# Patient Record
Sex: Female | Born: 1990 | Race: White | Hispanic: No | Smoking: Never smoker
Health system: Southern US, Community
[De-identification: ages and names within clinical notes are randomized; demographics above are authoritative.]

## PROBLEM LIST (undated history)

## (undated) ENCOUNTER — Ambulatory Visit

## (undated) DIAGNOSIS — Z8742 Personal history of other diseases of the female genital tract: Secondary | ICD-10-CM

## (undated) DIAGNOSIS — I10 Essential (primary) hypertension: Secondary | ICD-10-CM

## (undated) DIAGNOSIS — T7840XA Allergy, unspecified, initial encounter: Secondary | ICD-10-CM

## (undated) DIAGNOSIS — Z9889 Other specified postprocedural states: Secondary | ICD-10-CM

## (undated) HISTORY — DX: Essential (primary) hypertension: I10

## (undated) HISTORY — DX: Allergy, unspecified, initial encounter: T78.40XA

## (undated) HISTORY — DX: Personal history of other diseases of the female genital tract: Z87.42

## (undated) HISTORY — PX: WISDOM TOOTH EXTRACTION: SHX21

## (undated) HISTORY — DX: Other specified postprocedural states: Z98.890

## (undated) HISTORY — PX: ADENOIDECTOMY: SHX5191

---
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
## 2019-05-11 DIAGNOSIS — J3501 Chronic tonsillitis: Secondary | ICD-10-CM | POA: Insufficient documentation

## 2019-10-14 HISTORY — PX: CERVICAL POLYPECTOMY: SHX88

## 2020-08-08 ENCOUNTER — Ambulatory Visit: Admit: 2020-08-08 | Disposition: A | Discharge status: Home / Self Care | Payer: Self-pay

## 2020-08-08 ENCOUNTER — Other Ambulatory Visit: Payer: Self-pay

## 2020-08-08 ENCOUNTER — Ambulatory Visit
Admission: EM | Admit: 2020-08-08 | Discharge: 2020-08-08 | Disposition: A | Discharge status: Home / Self Care | Payer: 59 | Attending: Emergency Medicine | Admitting: Emergency Medicine

## 2020-08-08 DIAGNOSIS — J069 Acute upper respiratory infection, unspecified: Secondary | ICD-10-CM | POA: Diagnosis not present

## 2020-08-08 DIAGNOSIS — Z1152 Encounter for screening for COVID-19: Secondary | ICD-10-CM | POA: Diagnosis not present

## 2020-08-08 DIAGNOSIS — J358 Other chronic diseases of tonsils and adenoids: Secondary | ICD-10-CM

## 2020-08-08 MED ORDER — BENZONATATE 100 MG PO CAPS: 100.0000 mg | ORAL_CAPSULE | Freq: Three times a day (TID) | ORAL | 0 refills | Status: DC

## 2020-08-08 NOTE — ED Provider Notes (Signed)
[facility]    CSN: [id number] Arrival date & time: 08/08/20  1719      History   Chief Complaint Chief Complaint  Patient presents with  . Cough    since Monday  . Nasal Congestion    since Monday  . Fever    since Monday    HPI Lauren Larson is a 29 y.o. female  For URI symptoms since Monday.  Patient writes history: Endorsing dry cough with nasal congestion, postnasal drip, sore throat and fever.  Unknown T-max.  Taking OTC medications with some relief.  Denies chest pain, shortness of breath, vomiting or diarrhea.  No known sick exposures.  Did get rapid Covid test on day 1 of symptoms: Negative.  History reviewed. No pertinent past medical history.  There are no problems to display for this patient.   History reviewed. No pertinent surgical history.  OB History   No obstetric history on file.      Home Medications    Prior to Admission medications   Medication Sig Start Date End Date Taking? Authorizing Provider  benzonatate (TESSALON) 100 MG capsule Take 1 capsule (100 mg total) by mouth every 8 (eight) hours. 08/08/20   Hall-Potvin, [place], PA-C    Family History History reviewed. No pertinent family history.  Social History Social History   Tobacco Use  . Smoking status: Never Smoker  . Smokeless tobacco: Never Used  Vaping Use  . Vaping Use: Some days  Substance Use Topics  . Alcohol use: Yes  . Drug use: Never     Allergies   Patient has no known allergies.   Review of Systems Review of Systems  Constitutional: Positive for fever. Negative for fatigue.  HENT: Positive for postnasal drip, rhinorrhea and sore throat. Negative for congestion, dental problem, ear pain, facial swelling, hearing loss, sinus pain, trouble swallowing and voice change.   Eyes: Negative for photophobia, pain and visual disturbance.  Respiratory: Positive for cough. Negative for chest tightness, shortness of breath and wheezing.   Cardiovascular:  Negative for chest pain and palpitations.  Gastrointestinal: Negative for diarrhea and vomiting.  Musculoskeletal: Negative for arthralgias and myalgias.  Neurological: Negative for dizziness and headaches.     Physical Exam Triage Vital Signs ED Triage Vitals  Enc Vitals Group     BP 08/08/20 1726 112/71     Pulse Rate 08/08/20 1726 94     Resp 08/08/20 1726 18     Temp 08/08/20 1726 97.8 F (36.6 C)     Temp Source 08/08/20 1726 Oral     SpO2 08/08/20 1726 98 %     Weight --      Height --      Head Circumference --      Peak Flow --      Pain Score 08/08/20 1728 0     Pain Loc --      Pain Edu? --      Excl. in GC? --    No data found.  Updated Vital Signs BP 112/71 (BP Location: Right Arm)   Pulse 94   Temp 97.8 F (36.6 C) (Oral)   Resp 18   LMP 07/14/2020 (Approximate)   SpO2 98%   Visual Acuity Right Eye Distance:   Left Eye Distance:   Bilateral Distance:    Right Eye Near:   Left Eye Near:    Bilateral Near:     Physical Exam Constitutional:      General: She is not  in acute distress.    Appearance: She is not ill-appearing or diaphoretic.  HENT:     Head: Normocephalic and atraumatic.     Right Ear: Tympanic membrane and ear canal normal.     Left Ear: Tympanic membrane and ear canal normal.     Mouth/Throat:     Mouth: Mucous membranes are moist.     Pharynx: Oropharynx is clear. No oropharyngeal exudate or posterior oropharyngeal erythema.  Eyes:     General: No scleral icterus.    Conjunctiva/sclera: Conjunctivae normal.     Pupils: Pupils are equal, round, and reactive to light.  Neck:     Comments: Trachea midline, negative JVD Cardiovascular:     Rate and Rhythm: Normal rate and regular rhythm.     Heart sounds: No murmur heard.  No gallop.   Pulmonary:     Effort: Pulmonary effort is normal. No respiratory distress.     Breath sounds: No wheezing, rhonchi or rales.  Musculoskeletal:     Cervical back: Neck supple. No tenderness.   Lymphadenopathy:     Cervical: No cervical adenopathy.  Skin:    Capillary Refill: Capillary refill takes less than 2 seconds.     Coloration: Skin is not jaundiced or pale.     Findings: No rash.  Neurological:     General: No focal deficit present.     Mental Status: She is alert and oriented to person, place, and time.      UC Treatments / Results  Labs (all labs ordered are listed, but only abnormal results are displayed) Labs Reviewed  NOVEL CORONAVIRUS, NAA    EKG   Radiology No results found.  Procedures Procedures (including critical care time)  Medications Ordered in UC Medications - No data to display  Initial Impression / Assessment and Plan / UC Course  I have reviewed the triage vital signs and the nursing notes.  Pertinent labs & imaging results that were available during my care of the patient were reviewed by me and considered in my medical decision making (see chart for details).     Patient afebrile, nontoxic, with SpO2 98%.  Covid PCR pending.  Patient to quarantine until results are back.  We will treat supportively as outlined below.  Return precautions discussed, patient verbalized understanding and is agreeable to plan. Final Clinical Impressions(s) / UC Diagnoses   Final diagnoses:  Encounter for screening for COVID-19  URI with cough and congestion  Tonsil stone     Discharge Instructions     Your COVID test is pending - it is important to quarantine / isolate at home until your results are back. If you test positive and would like further evaluation for persistent or worsening symptoms, you may schedule an E-visit or virtual (video) visit throughout the [facility] app or website.  PLEASE NOTE: If you develop severe chest pain or shortness of breath please go to the ER or call 9-1-1 for further evaluation --> DO NOT schedule electronic or virtual visits for this. Please call our office for further guidance / recommendations as  needed.  For information about the Covid vaccine, please visit [email or web address]    ED Prescriptions    Medication Sig Dispense Auth. Provider   benzonatate (TESSALON) 100 MG capsule Take 1 capsule (100 mg total) by mouth every 8 (eight) hours. 21 capsule Hall-Potvin, [place], PA-C     PDMP not reviewed this encounter.   Hall-Potvin, [place], [place] 08/08/20 1853

## 2020-08-08 NOTE — Discharge Instructions (Addendum)
Your COVID test is pending - it is important to quarantine / isolate at home until your results are back. °If you test positive and would like further evaluation for persistent or worsening symptoms, you may schedule an E-visit or virtual (video) visit throughout the  MyChart app or website. ° °PLEASE NOTE: If you develop severe chest pain or shortness of breath please go to the ER or call 9-1-1 for further evaluation --> DO NOT schedule electronic or virtual visits for this. °Please call our office for further guidance / recommendations as needed. ° °For information about the Covid vaccine, please visit [email or web address] °

## 2020-08-08 NOTE — ED Triage Notes (Signed)
Pt states she has had cough, congestion, sore throat, and fever since Monday. Pt reports a rapid covid test on Monday with a negative result. Pt states the symptoms have worsened and otc meds have not provided much relief.

## 2020-08-09 LAB — SARS-COV-2, NAA 2 DAY TAT

## 2020-08-09 LAB — NOVEL CORONAVIRUS, NAA: SARS-CoV-2, NAA: NOT DETECTED

## 2020-09-16 ENCOUNTER — Ambulatory Visit
Admission: EM | Admit: 2020-09-16 | Discharge: 2020-09-16 | Disposition: A | Discharge status: Home / Self Care | Payer: 59 | Attending: Urgent Care | Admitting: Urgent Care

## 2020-09-16 ENCOUNTER — Other Ambulatory Visit: Payer: Self-pay

## 2020-09-16 DIAGNOSIS — K0889 Other specified disorders of teeth and supporting structures: Secondary | ICD-10-CM

## 2020-09-16 DIAGNOSIS — R591 Generalized enlarged lymph nodes: Secondary | ICD-10-CM

## 2020-09-16 MED ORDER — NAPROXEN 500 MG PO TABS: 500.0000 mg | ORAL_TABLET | Freq: Two times a day (BID) | ORAL | 0 refills | Status: DC

## 2020-09-16 MED ORDER — AMOXICILLIN-POT CLAVULANATE 875-125 MG PO TABS: 1.0000 | ORAL_TABLET | Freq: Two times a day (BID) | ORAL | 0 refills | Status: DC

## 2020-09-16 MED ORDER — FLUCONAZOLE 150 MG PO TABS: 150.0000 mg | ORAL_TABLET | ORAL | 0 refills | Status: DC

## 2020-09-16 MED ORDER — HYDROCODONE-ACETAMINOPHEN 5-325 MG PO TABS: 1.0000 | ORAL_TABLET | Freq: Four times a day (QID) | ORAL | 0 refills | Status: DC | PRN

## 2020-09-16 NOTE — ED Provider Notes (Signed)
  [facility]   MRN: [id number] DOB: 1991/08/25  Subjective:   Lauren Larson is a 29 y.o. female presenting for 1 day history of acute onset right-sided lower facial pain, dental pain, neck pain.  Patient is worried about having a dental abscess.  Has previously told that she needs to have her wisdom teeth and molars taken out.  She does not have a Education officer, community or dental insurance.  Has had a decreased appetite as well.  No current facility-administered medications for this encounter.  Current Outpatient Medications:  .  benzonatate (TESSALON) 100 MG capsule, Take 1 capsule (100 mg total) by mouth every 8 (eight) hours., Disp: 21 capsule, Rfl: 0   No Known Allergies  History reviewed. No pertinent past medical history.   History reviewed. No pertinent surgical history.  History reviewed. No pertinent family history.  Social History   Tobacco Use  . Smoking status: Never Smoker  . Smokeless tobacco: Never Used  Vaping Use  . Vaping Use: Some days  Substance Use Topics  . Alcohol use: Yes  . Drug use: Never    ROS   Objective:   Vitals: BP 112/64 (BP Location: Left Arm)   Pulse 71   Temp 98.2 F (36.8 C) (Oral)   Resp 17   LMP 09/16/2020 (Exact Date)   SpO2 98%   Physical Exam Constitutional:      General: She is not in acute distress.    Appearance: Normal appearance. She is well-developed. She is not ill-appearing, toxic-appearing or diaphoretic.  HENT:     Head: Normocephalic and atraumatic.     Right Ear: External ear normal.     Left Ear: External ear normal.     Nose: Nose normal.     Mouth/Throat:     Mouth: Mucous membranes are moist.     Pharynx: Oropharynx is clear.   Eyes:     General: No scleral icterus.       Right eye: No discharge.        Left eye: No discharge.     Extraocular Movements: Extraocular movements intact.     Conjunctiva/sclera: Conjunctivae normal.     Pupils: Pupils are equal, round, and reactive to light.    Cardiovascular:     Rate and Rhythm: Normal rate.  Pulmonary:     Effort: Pulmonary effort is normal.  Lymphadenopathy:     Cervical: Cervical adenopathy (left submandibular, superior anterior cervical) present.  Skin:    General: Skin is warm and dry.  Neurological:     General: No focal deficit present.     Mental Status: She is alert and oriented to person, place, and time.  Psychiatric:        Mood and Affect: Mood normal.        Behavior: Behavior normal.        Thought Content: Thought content normal.        Judgment: Judgment normal.      Assessment and Plan :   I have reviewed the PDMP during this encounter.  1. Pain, dental   2. Lymphadenopathy     Start Augmentin for dental infection/abscess, use naproxen for pain and inflammation, hydrocodone for breakthrough pain. Emphasized need for dental surgeon consult. Counseled patient on potential for adverse effects with medications prescribed/recommended today, strict ER and return-to-clinic precautions discussed, patient verbalized understanding.    Wallis Bamberg, PA-C 09/16/20 1349

## 2020-09-16 NOTE — Discharge Instructions (Addendum)
Please schedule naproxen twice daily with food for your severe pain.  If you still have pain despite taking naproxen regularly, this is breakthrough pain.  You can use hydrocodone, a narcotic pain medicine, once every 4-6 hours for this.  Once your pain is better controlled, switch back to just naproxen.   Make sure you schedule an appointment with a dentist/dental surgeon as soon as possible.  You may try some of the resources below.    [facility] [phone] extension [phone]  Dr. Civils [phone] [address]  [facility] [phone] [address]  [facility] [phone] extension [phone] [place], [place], [address] First come first serve for the first 10 clients.  May do simple extractions only, no wisdom teeth or surgery.  You may try the second for Thursday of the month starting at 6:30 AM.  [employer] You may call the school to see if they are still helping to provide dental care for emergent cases.  

## 2020-09-16 NOTE — ED Triage Notes (Signed)
Pt states she has had what feels like an abscess since yesterday but cannot visualize it. Pt states it is internal. Pt describes it in the area of her lymph node on left side. Pt states no other symptoms. Pt says it is not painful unless palpated. Pt is aox4 and ambulatory.

## 2021-07-25 ENCOUNTER — Encounter: Payer: Self-pay | Admitting: Emergency Medicine

## 2021-07-25 ENCOUNTER — Other Ambulatory Visit: Payer: Self-pay

## 2021-07-25 ENCOUNTER — Ambulatory Visit
Admission: EM | Admit: 2021-07-25 | Discharge: 2021-07-25 | Disposition: A | Discharge status: Home / Self Care | Payer: 59 | Attending: Internal Medicine | Admitting: Internal Medicine

## 2021-07-25 DIAGNOSIS — J029 Acute pharyngitis, unspecified: Secondary | ICD-10-CM | POA: Insufficient documentation

## 2021-07-25 DIAGNOSIS — J069 Acute upper respiratory infection, unspecified: Secondary | ICD-10-CM | POA: Insufficient documentation

## 2021-07-25 DIAGNOSIS — Z20822 Contact with and (suspected) exposure to covid-19: Secondary | ICD-10-CM | POA: Insufficient documentation

## 2021-07-25 LAB — POCT RAPID STREP A (OFFICE): Rapid Strep A Screen: NEGATIVE

## 2021-07-25 MED ORDER — PSEUDOEPHEDRINE HCL 30 MG PO TABS: 30.0000 mg | ORAL_TABLET | ORAL | 0 refills | Status: DC | PRN

## 2021-07-25 MED ORDER — CETIRIZINE HCL 10 MG PO TABS: 10.0000 mg | ORAL_TABLET | Freq: Every day | ORAL | 0 refills | Status: DC

## 2021-07-25 MED ORDER — FLUTICASONE PROPIONATE 50 MCG/ACT NA SUSP: 1.0000 | Freq: Every day | NASAL | 0 refills | Status: DC

## 2021-07-25 NOTE — ED Triage Notes (Signed)
Patient c/o sore throat x 4 days, head congestion, ears popping.  Patient has been taken Thera-Flu and Dayquil.  Patient is not vaccinated for COVID.

## 2021-07-25 NOTE — ED Provider Notes (Signed)
[facility]    CSN: [id number] Arrival date & time: 07/25/21  1224      History   Chief Complaint Chief Complaint  Patient presents with   Sore Throat    HPI Lauren Larson is a 30 y.o. female.   Patient presents with sore throat, nasal congestion, feeling of ear discomfort for 4 days.  Denies any cough.  Denies any fevers or known sick contacts.  Patient has been taking TheraFlu and DayQuil with improvement in symptoms.  Denies any chest pain or shortness of breath.  Denies nausea, vomiting, diarrhea.   Sore Throat   History reviewed. No pertinent past medical history.  There are no problems to display for this patient.   History reviewed. No pertinent surgical history.  OB History   No obstetric history on file.      Home Medications    Prior to Admission medications   Medication Sig Start Date End Date Taking? Authorizing Provider  cetirizine (ZYRTEC) 10 MG tablet Take 1 tablet (10 mg total) by mouth daily. 07/25/21  Yes Lance Muss, FNP  fluticasone (FLONASE) 50 MCG/ACT nasal spray Place 1 spray into both nostrils daily for 3 days. 07/25/21 07/28/21 Yes Lance Muss, FNP  pseudoephedrine (SUDAFED) 30 MG tablet Take 1 tablet (30 mg total) by mouth every 4 (four) hours as needed for congestion. 07/25/21  Yes Lance Muss, FNP  amoxicillin-clavulanate (AUGMENTIN) 875-125 MG tablet Take 1 tablet by mouth every 12 (twelve) hours. 09/16/20   Wallis Bamberg, PA-C  benzonatate (TESSALON) 100 MG capsule Take 1 capsule (100 mg total) by mouth every 8 (eight) hours. 08/08/20   Hall-Potvin, [place], PA-C  fluconazole (DIFLUCAN) 150 MG tablet Take 1 tablet (150 mg total) by mouth once a week. 09/16/20   Wallis Bamberg, PA-C  HYDROcodone-acetaminophen (NORCO/VICODIN) 5-325 MG tablet Take 1 tablet by mouth every 6 (six) hours as needed for severe pain. 09/16/20   Wallis Bamberg, PA-C  naproxen (NAPROSYN) 500 MG tablet Take 1 tablet (500 mg total) by mouth 2 (two)  times daily with a meal. 09/16/20   Wallis Bamberg, PA-C    Family History No family history on file.  Social History Social History   Tobacco Use   Smoking status: Never   Smokeless tobacco: Never  Vaping Use   Vaping Use: Some days  Substance Use Topics   Alcohol use: Yes   Drug use: Never     Allergies   Patient has no known allergies.   Review of Systems Review of Systems Per HPI  Physical Exam Triage Vital Signs ED Triage Vitals [07/25/21 1301]  Enc Vitals Group     BP 115/85     Pulse Rate 69     Resp 18     Temp 98.3 F (36.8 C)     Temp Source Oral     SpO2 98 %     Weight 180 lb (81.6 kg)     Height 5\' 3"  (1.6 m)     Head Circumference      Peak Flow      Pain Score 0     Pain Loc      Pain Edu?      Excl. in GC?    No data found.  Updated Vital Signs BP 115/85 (BP Location: Left Arm)   Pulse 69   Temp 98.3 F (36.8 C) (Oral)   Resp 18   Ht 5\' 3"  (1.6 m)   Wt 180 lb (81.6  kg)   LMP 07/24/2021   SpO2 98%   BMI 31.89 kg/m   Visual Acuity Right Eye Distance:   Left Eye Distance:   Bilateral Distance:    Right Eye Near:   Left Eye Near:    Bilateral Near:     Physical Exam Constitutional:      General: She is not in acute distress.    Appearance: Normal appearance. She is not toxic-appearing or diaphoretic.  HENT:     Head: Normocephalic and atraumatic.     Right Ear: Tympanic membrane and ear canal normal.     Left Ear: Tympanic membrane and ear canal normal.     Nose: Congestion present.     Mouth/Throat:     Mouth: Mucous membranes are moist.     Pharynx: Posterior oropharyngeal erythema present.  Eyes:     Extraocular Movements: Extraocular movements intact.     Conjunctiva/sclera: Conjunctivae normal.     Pupils: Pupils are equal, round, and reactive to light.  Cardiovascular:     Rate and Rhythm: Normal rate and regular rhythm.     Pulses: Normal pulses.     Heart sounds: Normal heart sounds.  Pulmonary:     Effort:  Pulmonary effort is normal. No respiratory distress.     Breath sounds: Normal breath sounds. No wheezing.  Abdominal:     General: Abdomen is flat. Bowel sounds are normal.     Palpations: Abdomen is soft.  Musculoskeletal:        General: Normal range of motion.     Cervical back: Normal range of motion.  Skin:    General: Skin is warm and dry.  Neurological:     General: No focal deficit present.     Mental Status: She is alert and oriented to person, place, and time. Mental status is at baseline.  Psychiatric:        Mood and Affect: Mood normal.        Behavior: Behavior normal.     UC Treatments / Results  Labs (all labs ordered are listed, but only abnormal results are displayed) Labs Reviewed  CULTURE, GROUP A STREP ([facility])  NOVEL CORONAVIRUS, NAA  POCT RAPID STREP A (OFFICE)    EKG   Radiology No results found.  Procedures Procedures (including critical care time)  Medications Ordered in UC Medications - No data to display  Initial Impression / Assessment and Plan / UC Course  I have reviewed the triage vital signs and the nursing notes.  Pertinent labs & imaging results that were available during my care of the patient were reviewed by me and considered in my medical decision making (see chart for details).     Patient presents with symptoms likely from a viral upper respiratory infection. Differential includes bacterial pneumonia, sinusitis, allergic rhinitis, Covid 19. Do not suspect underlying cardiopulmonary process. Symptoms seem unlikely related to ACS, CHF or COPD exacerbations, pneumonia, pneumothorax. Patient is nontoxic appearing and not in need of emergent medical intervention.  Rapid strep test was negative.  Throat culture and COVID-19 viral swab are pending.  Recommended symptom control with over the counter medications: Daily oral anti-histamine, Oral decongestant or IN corticosteroid, saline irrigations, cepacol lozenges, Robitussin, Delsym,  honey tea. Patient was offered scripts.  Return if symptoms fail to improve in 1-2 weeks or you develop shortness of breath, chest pain, severe headache. Patient states understanding and is agreeable.  Discharged with PCP followup.  Final Clinical Impressions(s) / UC Diagnoses   Final  diagnoses:  Viral upper respiratory infection  Sore throat  Encounter for laboratory testing for COVID-19 virus     Discharge Instructions      You likely having a viral upper respiratory infection. We recommended symptom control. I expect your symptoms to start improving in the next 1-2 weeks.   1. Take a daily allergy pill/anti-histamine like Zyrtec, Claritin, or Store brand consistently for 2 weeks.  You have been prescribed this medication.  2. For congestion you may try an oral decongestant like Mucinex or sudafed. You may also try intranasal flonase nasal spray or saline irrigations (neti pot, sinus cleanse).  You have been prescribed both of these medications.  3. For your sore throat you may try cepacol lozenges, salt water gargles, throat spray. Treatment of congestion may also help your sore throat.  4. For cough you may try Robitussen  5. Take Tylenol or Ibuprofen to help with pain/inflammation  6. Stay hydrated, drink plenty of fluids to keep throat coated and less irritated  Honey Tea For cough/sore throat try using a honey-based tea. Use 3 teaspoons of honey with juice squeezed from half lemon. Place shaved pieces of ginger into 1/2-1 cup of water and warm over stove top. Then mix the ingredients and repeat every 4 hours as needed.   Your rapid strep test was negative.  Throat culture and COVID-19 viral swab are pending.  We will call if they are positive.     ED Prescriptions     Medication Sig Dispense Auth. Provider   pseudoephedrine (SUDAFED) 30 MG tablet Take 1 tablet (30 mg total) by mouth every 4 (four) hours as needed for congestion. 30 tablet Lance Muss, FNP    cetirizine (ZYRTEC) 10 MG tablet Take 1 tablet (10 mg total) by mouth daily. 30 tablet Lance Muss, FNP   fluticasone (FLONASE) 50 MCG/ACT nasal spray Place 1 spray into both nostrils daily for 3 days. 16 g Lance Muss, FNP      PDMP not reviewed this encounter.   Lance Muss, FNP 07/25/21 1423

## 2021-07-25 NOTE — Discharge Instructions (Addendum)
You likely having a viral upper respiratory infection. We recommended symptom control. I expect your symptoms to start improving in the next 1-2 weeks.   1. Take a daily allergy pill/anti-histamine like Zyrtec, Claritin, or Store brand consistently for 2 weeks.  You have been prescribed this medication.  2. For congestion you may try an oral decongestant like Mucinex or sudafed. You may also try intranasal flonase nasal spray or saline irrigations (neti pot, sinus cleanse).  You have been prescribed both of these medications.  3. For your sore throat you may try cepacol lozenges, salt water gargles, throat spray. Treatment of congestion may also help your sore throat.  4. For cough you may try Robitussen  5. Take Tylenol or Ibuprofen to help with pain/inflammation  6. Stay hydrated, drink plenty of fluids to keep throat coated and less irritated  Honey Tea For cough/sore throat try using a honey-based tea. Use 3 teaspoons of honey with juice squeezed from half lemon. Place shaved pieces of ginger into 1/2-1 cup of water and warm over stove top. Then mix the ingredients and repeat every 4 hours as needed.   Your rapid strep test was negative.  Throat culture and COVID-19 viral swab are pending.  We will call if they are positive.

## 2021-07-26 LAB — NOVEL CORONAVIRUS, NAA: SARS-CoV-2, NAA: NOT DETECTED

## 2021-07-26 LAB — SARS-COV-2, NAA 2 DAY TAT

## 2021-07-28 LAB — CULTURE, GROUP A STREP (THRC)

## 2021-11-06 ENCOUNTER — Ambulatory Visit (INDEPENDENT_AMBULATORY_CARE_PROVIDER_SITE_OTHER): Payer: 59

## 2021-11-06 ENCOUNTER — Ambulatory Visit
Admission: RE | Admit: 2021-11-06 | Discharge: 2021-11-06 | Disposition: A | Discharge status: Home / Self Care | Payer: 59 | Source: Ambulatory Visit | Attending: Internal Medicine | Admitting: Internal Medicine

## 2021-11-06 VITALS — BP 137/71 | HR 90 | Temp 98.7°F | Resp 18

## 2021-11-06 DIAGNOSIS — R0781 Pleurodynia: Secondary | ICD-10-CM | POA: Diagnosis not present

## 2021-11-06 DIAGNOSIS — R1012 Left upper quadrant pain: Secondary | ICD-10-CM

## 2021-11-06 DIAGNOSIS — R079 Chest pain, unspecified: Secondary | ICD-10-CM

## 2021-11-06 MED ORDER — METHOCARBAMOL 500 MG PO TABS: 500.0000 mg | ORAL_TABLET | Freq: Every evening | ORAL | 0 refills | Status: DC | PRN

## 2021-11-06 MED ORDER — IBUPROFEN 600 MG PO TABS: 600.0000 mg | ORAL_TABLET | Freq: Four times a day (QID) | ORAL | 0 refills | Status: DC | PRN

## 2021-11-06 NOTE — ED Triage Notes (Signed)
Pt c/o LUQ pain worse at night. Sharp pain, cramping, constipation (chronic).   Denies nausea, vomiting, diarrhea.   Onset ~ 3 weeks ago

## 2021-11-06 NOTE — Discharge Instructions (Addendum)
Please take medications as prescribed Gentle range of motion exercises Please do not drive or operate heavy machinery after taking muscle relaxants because they will make you drowsy I recommend you take muscle relaxant only at bedtime Heating pad use on a 20-minute on-20 minutes off cycle is recommended Return to urgent care if symptoms worsen Chest x-ray is negative for fracture.

## 2021-11-07 NOTE — ED Provider Notes (Signed)
[facility]    CSN: [id number] Arrival date & time: 11/06/21  1651      History   Chief Complaint Chief Complaint  Patient presents with   Abdominal Pain    HPI Lauren Larson is a 31 y.o. female comes to the urgent care with a 3-week history of left upper quadrant abdominal pain.  Patient's symptoms started 3 weeks ago and has been persistent.  Pain is described as sharp and aggravated by movement and laying back.  Pain is typically worse at night.  Is not associated with oral intake.  No history of trauma.  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  She has used heating pad 3 hours at a time with no relief in her symptoms.  Patient denies any shortness of breath, cough or sputum production.  No cough..  No long distance travel.  No fever or chills.  Patient has a history of constipation but had a bowel movement yesterday.  No bloody stools or change in stool caliber.  No night sweats, weight loss or early satiety.  HPI  History reviewed. No pertinent past medical history.  There are no problems to display for this patient.   History reviewed. No pertinent surgical history.  OB History   No obstetric history on file.      Home Medications    Prior to Admission medications   Medication Sig Start Date End Date Taking? Authorizing Provider  ibuprofen (ADVIL) 600 MG tablet Take 1 tablet (600 mg total) by mouth every 6 (six) hours as needed. 11/06/21  Yes Gabby Rackers, Britta Mccreedy, MD  methocarbamol (ROBAXIN) 500 MG tablet Take 1 tablet (500 mg total) by mouth at bedtime as needed for muscle spasms. 11/06/21  Yes Rudie Rikard, Britta Mccreedy, MD  cetirizine (ZYRTEC) 10 MG tablet Take 1 tablet (10 mg total) by mouth daily. 07/25/21   Gustavus Bryant, FNP  fluticasone (FLONASE) 50 MCG/ACT nasal spray Place 1 spray into both nostrils daily for 3 days. 07/25/21 07/28/21  Gustavus Bryant, FNP    Family History History reviewed. No pertinent family history.  Social  History Social History   Tobacco Use   Smoking status: Never   Smokeless tobacco: Never  Vaping Use   Vaping Use: Some days  Substance Use Topics   Alcohol use: Yes   Drug use: Never     Allergies   Patient has no known allergies.   Review of Systems Review of Systems  HENT: Negative.    Respiratory:  Negative for cough, chest tightness and shortness of breath.   Cardiovascular:  Positive for chest pain.  Gastrointestinal:  Positive for abdominal pain and constipation. Negative for diarrhea, nausea and vomiting.  Musculoskeletal: Negative.     Physical Exam Triage Vital Signs ED Triage Vitals  Enc Vitals Group     BP 11/06/21 1736 137/71     Pulse Rate 11/06/21 1736 90     Resp 11/06/21 1736 18     Temp 11/06/21 1736 98.7 F (37.1 C)     Temp Source 11/06/21 1736 Oral     SpO2 11/06/21 1736 99 %     Weight --      Height --      Head Circumference --      Peak Flow --      Pain Score 11/06/21 1737 0     Pain Loc --      Pain Edu? --  Excl. in GC? --    No data found.  Updated Vital Signs BP 137/71 (BP Location: Left Arm)    Pulse 90    Temp 98.7 F (37.1 C) (Oral)    Resp 18    SpO2 99%   Visual Acuity Right Eye Distance:   Left Eye Distance:   Bilateral Distance:    Right Eye Near:   Left Eye Near:    Bilateral Near:     Physical Exam Vitals and nursing note reviewed.  Constitutional:      General: She is in acute distress.     Appearance: She is not ill-appearing.  Cardiovascular:     Rate and Rhythm: Normal rate and regular rhythm.  Pulmonary:     Effort: Pulmonary effort is normal. No respiratory distress.     Breath sounds: Normal breath sounds. No wheezing.  Abdominal:     General: Abdomen is flat. Bowel sounds are normal.     Palpations: Abdomen is soft. There is no shifting dullness, fluid wave, hepatomegaly or splenomegaly.     Tenderness: There is no abdominal tenderness.     Comments: Tenderness to palpation over the lower  part of the left chest  Neurological:     Mental Status: She is alert.     UC Treatments / Results  Labs (all labs ordered are listed, but only abnormal results are displayed) Labs Reviewed - No data to display  EKG   Radiology DG Ribs Unilateral W/Chest Left  Result Date: 11/06/2021 CLINICAL DATA:  Left lower chest pain, left upper quadrant pain. EXAM: LEFT RIBS AND CHEST - 3+ VIEW COMPARISON:  08/24/2009 FINDINGS: No fracture or other bone lesions are seen involving the ribs. There is no evidence of pneumothorax or pleural effusion. Both lungs are clear. Heart size and mediastinal contours are within normal limits. IMPRESSION: Negative. Electronically Signed   By: Charlett Nose M.D.   On: 11/06/2021 18:48    Procedures Procedures (including critical care time)  Medications Ordered in UC Medications - No data to display  Initial Impression / Assessment and Plan / UC Course  I have reviewed the triage vital signs and the nursing notes.  Pertinent labs & imaging results that were available during my care of the patient were reviewed by me and considered in my medical decision making (see chart for details).     1.  Left-sided chest pain: This is likely musculoskeletal Abdominal exam is unremarkable Ibuprofen as needed for pain Robaxin given with precautions. Heating pad use 20 minutes on-20 minutes off Gentle range of motion exercises Chest x-ray is negative for acute lung infiltrate or rib fractures. Return precautions given Final Clinical Impressions(s) / UC Diagnoses   Final diagnoses:  Rib pain on left side     Discharge Instructions      Please take medications as prescribed Gentle range of motion exercises Please do not drive or operate heavy machinery after taking muscle relaxants because they will make you drowsy I recommend you take muscle relaxant only at bedtime Heating pad use on a 20-minute on-20 minutes off cycle is recommended Return to urgent care  if symptoms worsen Chest x-ray is negative for fracture.   ED Prescriptions     Medication Sig Dispense Auth. Provider   ibuprofen (ADVIL) 600 MG tablet Take 1 tablet (600 mg total) by mouth every 6 (six) hours as needed. 30 tablet Doris Gruhn, Britta Mccreedy, MD   methocarbamol (ROBAXIN) 500 MG tablet Take 1 tablet (500 mg total)  by mouth at bedtime as needed for muscle spasms. 20 tablet Kona Yusuf, Britta MccreedyPhilip O, MD      PDMP not reviewed this encounter.   Merrilee JanskyLamptey, Sandeep Delagarza O, MD 11/07/21 [phone]

## 2022-04-29 ENCOUNTER — Ambulatory Visit
Admission: RE | Admit: 2022-04-29 | Discharge: 2022-04-29 | Discharge status: Absent without leave | Payer: 59 | Source: Ambulatory Visit

## 2022-05-01 ENCOUNTER — Ambulatory Visit
Admission: RE | Admit: 2022-05-01 | Discharge: 2022-05-01 | Disposition: A | Discharge status: Home / Self Care | Payer: 59 | Source: Ambulatory Visit | Attending: Internal Medicine | Admitting: Internal Medicine

## 2022-05-01 VITALS — BP 124/79 | HR 89 | Temp 98.1°F | Resp 18

## 2022-05-01 DIAGNOSIS — J069 Acute upper respiratory infection, unspecified: Secondary | ICD-10-CM | POA: Diagnosis not present

## 2022-05-01 MED ORDER — FLUTICASONE PROPIONATE 50 MCG/ACT NA SUSP: 1.0000 | Freq: Every day | NASAL | 0 refills | Status: DC

## 2022-05-01 MED ORDER — PROMETHAZINE-DM 6.25-15 MG/5ML PO SYRP: 5.0000 mL | ORAL_SOLUTION | Freq: Four times a day (QID) | ORAL | 0 refills | Status: DC | PRN

## 2022-05-01 NOTE — ED Triage Notes (Signed)
Pt is present today with c/o chest congestion, cough, and sore throat. Pt sx started Monday

## 2022-05-01 NOTE — ED Provider Notes (Signed)
[facility]    CSN: [id number] Arrival date & time: 05/01/22  1241      History   Chief Complaint Chief Complaint  Patient presents with   Cough    Sore throat, cough with congestion - Entered by patient   Sore Throat   Nasal Congestion    HPI Lauren Larson is a 31 y.o. female.   Patient presents with nasal congestion, sore throat, productive cough, chest congestion that has been present for about 3 days.  Denies any known sick contacts but does report that she attended a concert prior to symptoms starting.  Denies any known fevers at home.  Denies chest pain, shortness of breath, ear pain, nausea, vomiting, diarrhea, abdominal pain.  Patient has taken Mucinex with minimal improvement in symptoms.  Denies history of asthma.   Cough Sore Throat    History reviewed. No pertinent past medical history.  There are no problems to display for this patient.   History reviewed. No pertinent surgical history.  OB History   No obstetric history on file.      Home Medications    Prior to Admission medications   Medication Sig Start Date End Date Taking? Authorizing Provider  fluticasone (FLONASE) 50 MCG/ACT nasal spray Place 1 spray into both nostrils daily. 05/01/22  Yes Yobany Vroom, Acie Fredrickson, FNP  promethazine-dextromethorphan (PROMETHAZINE-DM) 6.25-15 MG/5ML syrup Take 5 mLs by mouth 4 (four) times daily as needed for cough. 05/01/22  Yes Dulcie Gammon, Acie Fredrickson, FNP  cetirizine (ZYRTEC) 10 MG tablet Take 1 tablet (10 mg total) by mouth daily. 07/25/21   Gustavus Bryant, FNP  ibuprofen (ADVIL) 600 MG tablet Take 1 tablet (600 mg total) by mouth every 6 (six) hours as needed. 11/06/21   Lamptey, Britta Mccreedy, MD  methocarbamol (ROBAXIN) 500 MG tablet Take 1 tablet (500 mg total) by mouth at bedtime as needed for muscle spasms. 11/06/21   Lamptey, Britta Mccreedy, MD    Family History Family History  Problem Relation Age of Onset   Diabetes Mother     Social History Social History    Tobacco Use   Smoking status: Never   Smokeless tobacco: Never  Vaping Use   Vaping Use: Some days  Substance Use Topics   Alcohol use: Yes   Drug use: Never     Allergies   Patient has no known allergies.   Review of Systems Review of Systems Per HPI  Physical Exam Triage Vital Signs ED Triage Vitals  Enc Vitals Group     BP 05/01/22 1250 124/79     Pulse Rate 05/01/22 1250 89     Resp 05/01/22 1250 18     Temp 05/01/22 1250 98.1 F (36.7 C)     Temp src --      SpO2 05/01/22 1250 95 %     Weight --      Height --      Head Circumference --      Peak Flow --      Pain Score 05/01/22 1249 0     Pain Loc --      Pain Edu? --      Excl. in GC? --    No data found.  Updated Vital Signs BP 124/79   Pulse 89   Temp 98.1 F (36.7 C)   Resp 18   SpO2 95%   Visual Acuity Right Eye Distance:   Left Eye Distance:   Bilateral Distance:    Right Eye Near:  Left Eye Near:    Bilateral Near:     Physical Exam Constitutional:      General: She is not in acute distress.    Appearance: Normal appearance. She is not toxic-appearing or diaphoretic.  HENT:     Head: Normocephalic and atraumatic.     Right Ear: Tympanic membrane and ear canal normal.     Left Ear: Tympanic membrane and ear canal normal.     Nose: Congestion present.     Mouth/Throat:     Mouth: Mucous membranes are moist.     Pharynx: No posterior oropharyngeal erythema.  Eyes:     Extraocular Movements: Extraocular movements intact.     Conjunctiva/sclera: Conjunctivae normal.     Pupils: Pupils are equal, round, and reactive to light.  Cardiovascular:     Rate and Rhythm: Normal rate and regular rhythm.     Pulses: Normal pulses.     Heart sounds: Normal heart sounds.  Pulmonary:     Effort: Pulmonary effort is normal. No respiratory distress.     Breath sounds: Normal breath sounds. No stridor. No wheezing, rhonchi or rales.  Abdominal:     General: Abdomen is flat. Bowel sounds  are normal.     Palpations: Abdomen is soft.  Musculoskeletal:        General: Normal range of motion.     Cervical back: Normal range of motion.  Skin:    General: Skin is warm and dry.  Neurological:     General: No focal deficit present.     Mental Status: She is alert and oriented to person, place, and time. Mental status is at baseline.  Psychiatric:        Mood and Affect: Mood normal.        Behavior: Behavior normal.      UC Treatments / Results  Labs (all labs ordered are listed, but only abnormal results are displayed) Labs Reviewed - No data to display  EKG   Radiology No results found.  Procedures Procedures (including critical care time)  Medications Ordered in UC Medications - No data to display  Initial Impression / Assessment and Plan / UC Course  I have reviewed the triage vital signs and the nursing notes.  Pertinent labs & imaging results that were available during my care of the patient were reviewed by me and considered in my medical decision making (see chart for details).     Patient presents with symptoms likely from a viral upper respiratory infection. Differential includes bacterial pneumonia, sinusitis, allergic rhinitis, COVID-19, flu. Do not suspect underlying cardiopulmonary process. Symptoms seem unlikely related to ACS, CHF or COPD exacerbations, pneumonia, pneumothorax. Patient is nontoxic appearing and not in need of emergent medical intervention.  No suspicion for strep throat given appearance of posterior pharynx exam so will defer strep testing.  Patient offered COVID testing but declined.  Recommended symptom control with over medications to treat symptoms.  Promethazine DM and Flonase prescribed for patient.  Advised patient that cough medication can cause drowsiness.  Discussed supportive care with patient.   Return if symptoms fail to improve in 1-2 weeks or you develop shortness of breath, chest pain, severe headache. Patient  states understanding and is agreeable.  Discharged with PCP followup.  Final Clinical Impressions(s) / UC Diagnoses   Final diagnoses:  Viral upper respiratory tract infection with cough     Discharge Instructions      It appears that you have a viral upper respiratory infection that  should run its course and self resolve with symptomatic treatment.  You have been sent 2 medications to alleviate symptoms.  Please be advised that the cough medication can cause drowsiness.  Please follow-up if symptoms persist or worsen.    ED Prescriptions     Medication Sig Dispense Auth. Provider   fluticasone (FLONASE) 50 MCG/ACT nasal spray Place 1 spray into both nostrils daily. 16 g Ervin Knack E, [place]   promethazine-dextromethorphan (PROMETHAZINE-DM) 6.25-15 MG/5ML syrup Take 5 mLs by mouth 4 (four) times daily as needed for cough. 118 mL Gustavus Bryant, [place]      PDMP not reviewed this encounter.   Gustavus Bryant, [place] 05/01/22 1304

## 2022-05-01 NOTE — Discharge Instructions (Signed)
It appears that you have a viral upper respiratory infection that should run its course and self resolve with symptomatic treatment.  You have been sent 2 medications to alleviate symptoms.  Please be advised that the cough medication can cause drowsiness.  Please follow-up if symptoms persist or worsen.

## 2022-05-05 ENCOUNTER — Ambulatory Visit: Admit: 2022-05-05 | Discharge status: Against Medical Advice | Payer: 59

## 2022-05-06 ENCOUNTER — Ambulatory Visit: Payer: 59

## 2022-08-15 IMAGING — DX DG RIBS W/ CHEST 3+V*L*
5 series · 5 of 5 positions shown · non-contrast
Comparison: 08/24/2009

CLINICAL DATA: Left lower chest pain, left upper quadrant pain.

EXAM:
LEFT RIBS AND CHEST - 3+ VIEW

[chest pa]
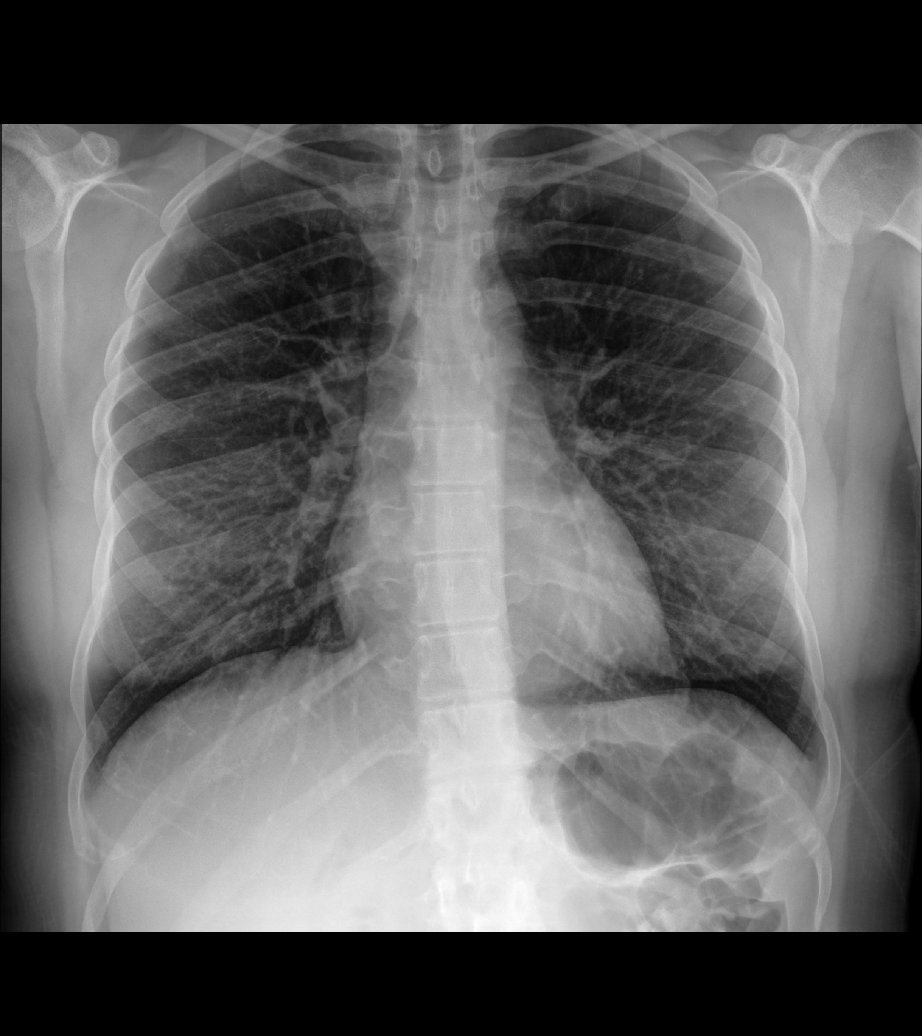

[ribs pa upper]
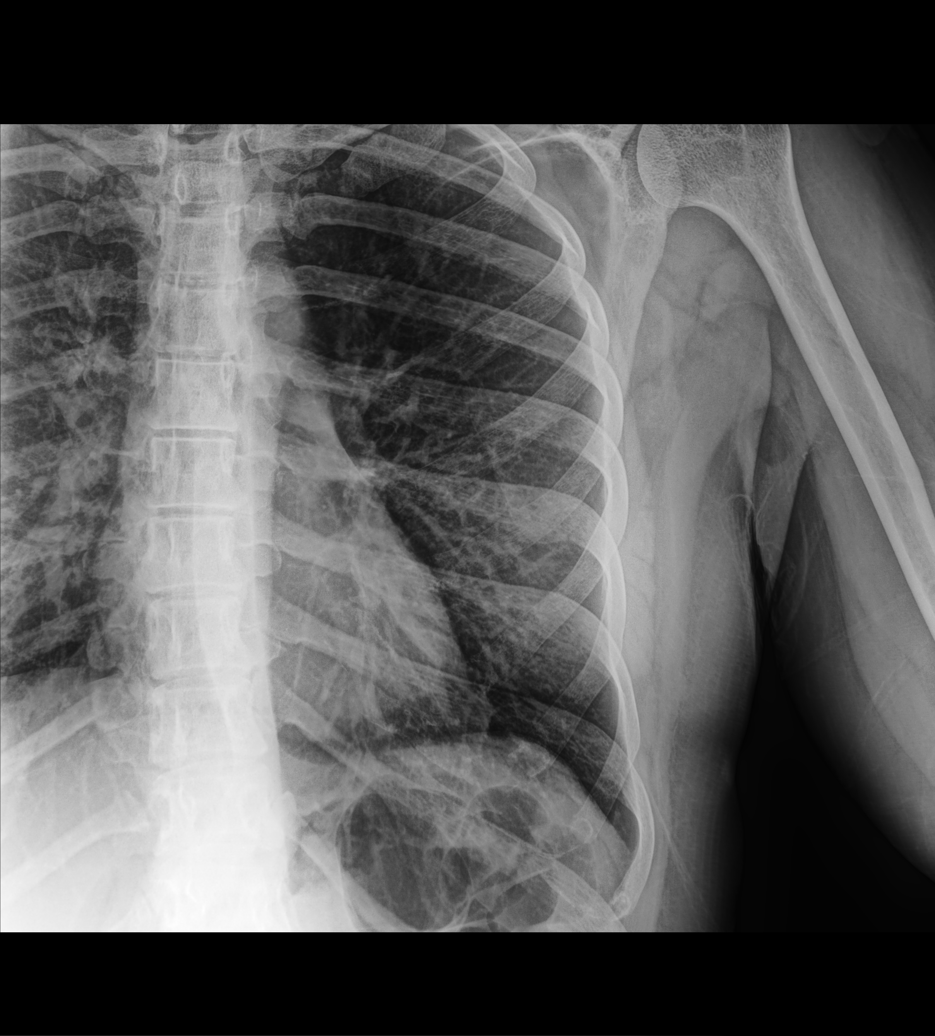

[ribs pa lower]
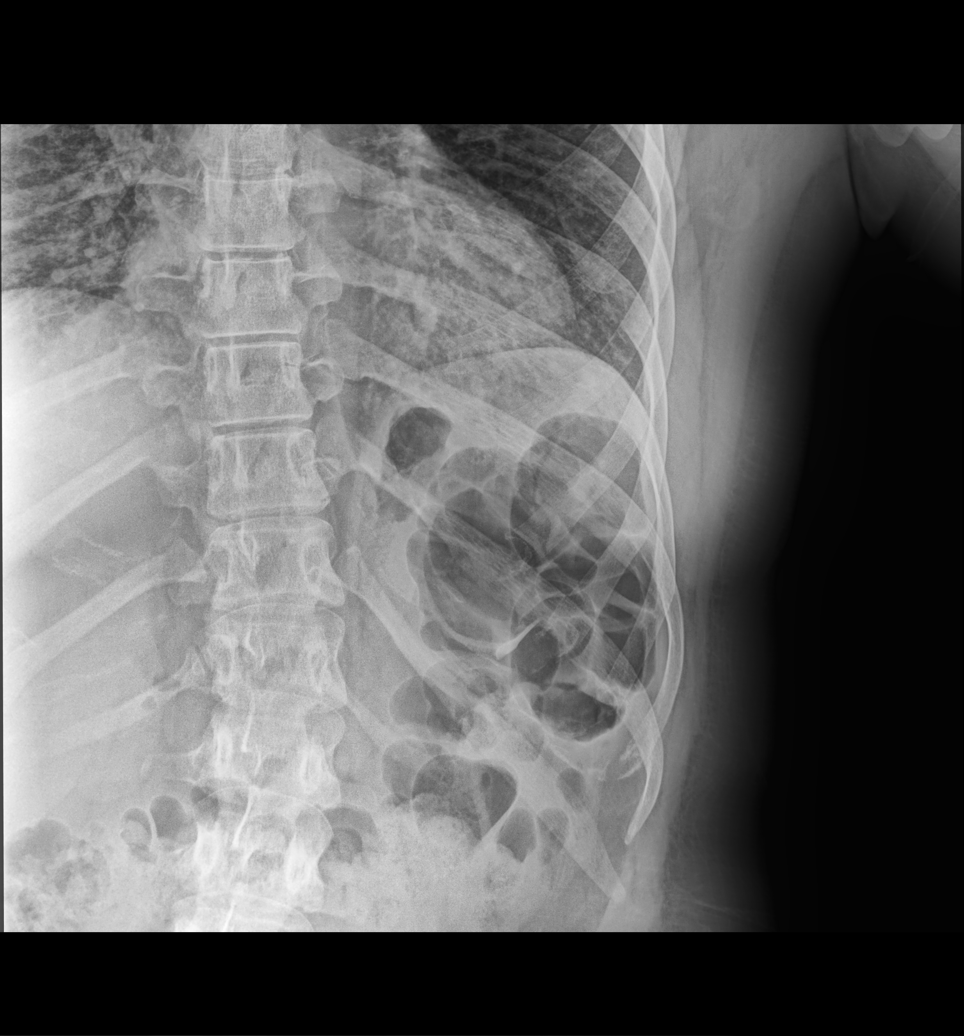

[ribs pa obl upper]
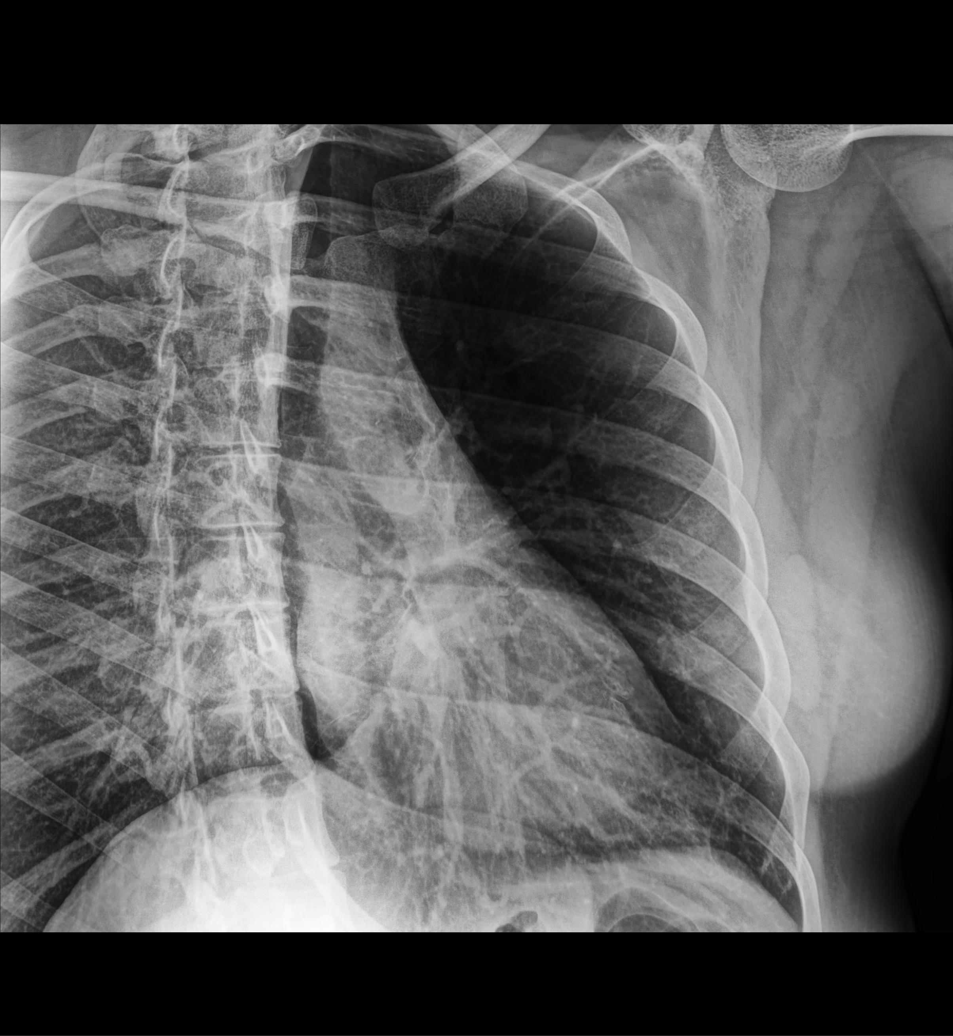

[ribs pa obl lower]
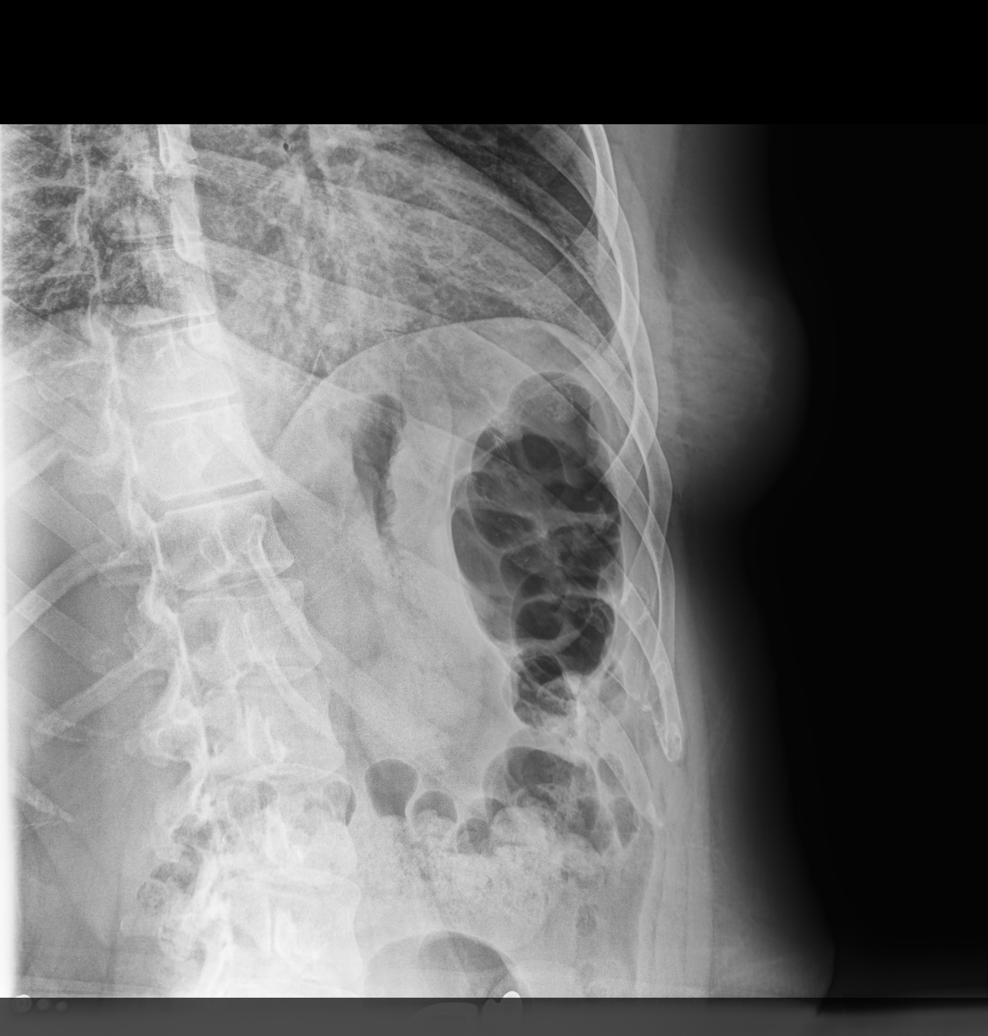

[5 of 5 positions shown; findings below may reference images not displayed]

FINDINGS: No fracture or other bone lesions are seen involving the ribs. There
is no evidence of pneumothorax or pleural effusion. Both lungs are
clear. Heart size and mediastinal contours are within normal limits.
IMPRESSION: Negative.

## 2023-07-21 ENCOUNTER — Ambulatory Visit
Admission: EM | Admit: 2023-07-21 | Discharge: 2023-07-21 | Disposition: A | Discharge status: Home / Self Care | Payer: Medicaid Other

## 2023-07-21 ENCOUNTER — Ambulatory Visit: Payer: Self-pay

## 2023-07-21 DIAGNOSIS — L0100 Impetigo, unspecified: Secondary | ICD-10-CM | POA: Diagnosis not present

## 2023-07-21 LAB — LAB REPORT - SCANNED: EGFR: 103

## 2023-07-21 MED ORDER — AMOXICILLIN-POT CLAVULANATE 875-125 MG PO TABS: 1.0000 | ORAL_TABLET | Freq: Two times a day (BID) | ORAL | 0 refills | Status: DC

## 2023-07-21 MED ORDER — MUPIROCIN 2 % EX OINT: 1.0000 | TOPICAL_OINTMENT | Freq: Two times a day (BID) | CUTANEOUS | 0 refills | Status: AC

## 2023-07-21 NOTE — ED Provider Notes (Signed)
[facility]    CSN: [id number] Arrival date & time: 07/21/23  1135      History   Chief Complaint Chief Complaint  Patient presents with   Skin Problem    HPI Lauren Larson is a 32 y.o. female.   Patient here today for evaluation of fever, sore throat that started last Thursday and improved by that night. She is concerned because lymph nodes seem to be enlarged and her ears continue to pop. She also notes that there is "sore" inside her nose that concerns her. She has not had any vomiting or diarrhea.   The history is provided by the patient.    History reviewed. No pertinent past medical history.  Patient Active Problem List   Diagnosis Date Noted   Chronic tonsillitis 05/11/2019    History reviewed. No pertinent surgical history.  OB History   No obstetric history on file.      Home Medications    Prior to Admission medications   Medication Sig Start Date End Date Taking? Authorizing Provider  amoxicillin-clavulanate (AUGMENTIN) 875-125 MG tablet Take 1 tablet by mouth every 12 (twelve) hours. 07/21/23  Yes Tomi Bamberger, PA-C  mupirocin ointment (BACTROBAN) 2 % Apply 1 Application topically 2 (two) times daily for 7 days. 07/21/23 07/28/23 Yes Tomi Bamberger, PA-C  cetirizine (ZYRTEC) 10 MG tablet Take 1 tablet (10 mg total) by mouth daily. 07/25/21   Gustavus Bryant, FNP  fluticasone (FLONASE) 50 MCG/ACT nasal spray Place 1 spray into both nostrils daily. 05/01/22   Gustavus Bryant, FNP  ibuprofen (ADVIL) 600 MG tablet Take 1 tablet (600 mg total) by mouth every 6 (six) hours as needed. 11/06/21   Lamptey, Britta Mccreedy, MD  methocarbamol (ROBAXIN) 500 MG tablet Take 1 tablet (500 mg total) by mouth at bedtime as needed for muscle spasms. 11/06/21   Merrilee Jansky, MD  promethazine-dextromethorphan (PROMETHAZINE-DM) 6.25-15 MG/5ML syrup Take 5 mLs by mouth 4 (four) times daily as needed for cough. 05/01/22   Gustavus Bryant, FNP    Family History Family  History  Problem Relation Age of Onset   Diabetes Mother    Fibromyalgia Mother    Heart disease Maternal Grandmother        History of Stints   Heart disease Maternal Aunt        History of Stints    Social History Social History   Tobacco Use   Smoking status: Never   Smokeless tobacco: Never  Vaping Use   Vaping status: Some Days   Substances: Nicotine, Flavoring  Substance Use Topics   Alcohol use: Yes    Comment: Occassionally.   Drug use: Never     Allergies   Patient has no known allergies.   Review of Systems Review of Systems  Constitutional:  Negative for chills and fever.  HENT:  Positive for congestion, sinus pressure and sore throat. Negative for ear pain.   Eyes:  Negative for discharge and redness.  Respiratory:  Negative for cough, shortness of breath and wheezing.   Gastrointestinal:  Negative for abdominal pain, diarrhea, nausea and vomiting.     Physical Exam Triage Vital Signs ED Triage Vitals  Encounter Vitals Group     BP 07/21/23 1153 (!) 150/100     Systolic BP Percentile --      Diastolic BP Percentile --      Pulse Rate 07/21/23 1153 83     Resp 07/21/23 1153 18  Temp 07/21/23 1153 98.7 F (37.1 C)     Temp Source 07/21/23 1153 Oral     SpO2 07/21/23 1153 99 %     Weight 07/21/23 1156 185 lb (83.9 kg)     Height 07/21/23 1156 5\' 3"  (1.6 m)     Head Circumference --      Peak Flow --      Pain Score 07/21/23 1156 0     Pain Loc --      Pain Education --      Exclude from Growth Chart --    No data found.  Updated Vital Signs BP (!) 148/90 (BP Location: Right Arm)   Pulse 83   Temp 98.7 F (37.1 C) (Oral)   Resp 18   Ht 5\' 3"  (1.6 m)   Wt 185 lb (83.9 kg)   LMP 07/13/2023 (Exact Date)   SpO2 99%   BMI 32.77 kg/m      Physical Exam Vitals and nursing note reviewed.  Constitutional:      General: She is not in acute distress.    Appearance: Normal appearance. She is not ill-appearing.  HENT:     Head:  Normocephalic and atraumatic.     Nose: Congestion present.     Comments: Honey crusted lesion in distal nares    Mouth/Throat:     Mouth: Mucous membranes are moist.     Pharynx: No oropharyngeal exudate or posterior oropharyngeal erythema.  Eyes:     Conjunctiva/sclera: Conjunctivae normal.  Cardiovascular:     Rate and Rhythm: Normal rate and regular rhythm.     Heart sounds: Normal heart sounds. No murmur heard. Pulmonary:     Effort: Pulmonary effort is normal. No respiratory distress.     Breath sounds: Normal breath sounds. No wheezing, rhonchi or rales.  Skin:    General: Skin is warm and dry.  Neurological:     Mental Status: She is alert.  Psychiatric:        Mood and Affect: Mood normal.        Thought Content: Thought content normal.      UC Treatments / Results  Labs (all labs ordered are listed, but only abnormal results are displayed) Labs Reviewed - No data to display  EKG   Radiology No results found.  Procedures Procedures (including critical care time)  Medications Ordered in UC Medications - No data to display  Initial Impression / Assessment and Plan / UC Course  I have reviewed the triage vital signs and the nursing notes.  Pertinent labs & imaging results that were available during my care of the patient were reviewed by me and considered in my medical decision making (see chart for details).    Suspect viral etiology of improving upper respiratory symptoms. Will treat to cover impetigo with oral augmentin as well as topical mupirocin.   Final Clinical Impressions(s) / UC Diagnoses   Final diagnoses:  Impetigo   Discharge Instructions   None    ED Prescriptions     Medication Sig Dispense Auth. Provider   amoxicillin-clavulanate (AUGMENTIN) 875-125 MG tablet Take 1 tablet by mouth every 12 (twelve) hours. 14 tablet Erma Pinto F, PA-C   mupirocin ointment (BACTROBAN) 2 % Apply 1 Application topically 2 (two) times daily for 7  days. 22 g Tomi Bamberger, PA-C      PDMP not reviewed this encounter.   Tomi Bamberger, PA-C 07/27/23 2241

## 2023-07-21 NOTE — ED Triage Notes (Signed)
"  I came home from work Thursday with Fever/sore throat, those symptoms went away Thursday night but I still have swollen lymph nodes in my neck and my ears are popping". "I am concerned with a sore (and the recurrence) of them. Sore is "on my nose currently".

## 2023-07-31 ENCOUNTER — Ambulatory Visit: Payer: Medicaid Other | Admitting: Family Medicine

## 2023-07-31 ENCOUNTER — Encounter: Payer: Self-pay | Admitting: Family Medicine

## 2023-07-31 VITALS — BP 122/80 | HR 108 | Temp 97.0°F | Ht 63.5 in | Wt 185.4 lb

## 2023-07-31 DIAGNOSIS — L650 Telogen effluvium: Secondary | ICD-10-CM | POA: Insufficient documentation

## 2023-07-31 DIAGNOSIS — R030 Elevated blood-pressure reading, without diagnosis of hypertension: Secondary | ICD-10-CM | POA: Insufficient documentation

## 2023-07-31 DIAGNOSIS — J302 Other seasonal allergic rhinitis: Secondary | ICD-10-CM | POA: Insufficient documentation

## 2023-07-31 DIAGNOSIS — R768 Other specified abnormal immunological findings in serum: Secondary | ICD-10-CM | POA: Insufficient documentation

## 2023-07-31 DIAGNOSIS — D1801 Hemangioma of skin and subcutaneous tissue: Secondary | ICD-10-CM | POA: Insufficient documentation

## 2023-07-31 DIAGNOSIS — F1290 Cannabis use, unspecified, uncomplicated: Secondary | ICD-10-CM | POA: Insufficient documentation

## 2023-07-31 DIAGNOSIS — L659 Nonscarring hair loss, unspecified: Secondary | ICD-10-CM | POA: Insufficient documentation

## 2023-07-31 DIAGNOSIS — R21 Rash and other nonspecific skin eruption: Secondary | ICD-10-CM | POA: Insufficient documentation

## 2023-07-31 DIAGNOSIS — N920 Excessive and frequent menstruation with regular cycle: Secondary | ICD-10-CM | POA: Diagnosis not present

## 2023-07-31 DIAGNOSIS — Z13220 Encounter for screening for lipoid disorders: Secondary | ICD-10-CM | POA: Diagnosis not present

## 2023-07-31 DIAGNOSIS — Z23 Encounter for immunization: Secondary | ICD-10-CM

## 2023-07-31 DIAGNOSIS — K529 Noninfective gastroenteritis and colitis, unspecified: Secondary | ICD-10-CM | POA: Insufficient documentation

## 2023-07-31 DIAGNOSIS — Z1159 Encounter for screening for other viral diseases: Secondary | ICD-10-CM

## 2023-07-31 LAB — LIPID PANEL
Cholesterol: 161 mg/dL (ref 0–200)
HDL: 52.5 mg/dL (ref >39.00)
LDL Cholesterol: 93 mg/dL (ref 0–99)
NonHDL: 108.92
Total CHOL/HDL Ratio: 3
Triglycerides: 79 mg/dL (ref 0.0–149.0)
VLDL: 15.8 mg/dL (ref 0.0–40.0)

## 2023-07-31 LAB — SEDIMENTATION RATE: Sed Rate: 18 mm/h (ref 0–20)

## 2023-07-31 LAB — C-REACTIVE PROTEIN: CRP: <1 mg/dL (ref 0.5–20.0)

## 2023-07-31 NOTE — Assessment & Plan Note (Signed)
Lauren Larson shared a photograph of a large amount of hair that she has had come out when she brushes. At this point, there is no clear sign of alopecia. Recent thyroid testing was normal.

## 2023-07-31 NOTE — Assessment & Plan Note (Signed)
Etiology is not clear. Recent CBC and CMP are normal. I will check some additional stool studies. If negative, consider GI referral.

## 2023-07-31 NOTE — Assessment & Plan Note (Signed)
The ANA is significantly high. Lauren Larson has some facial rash and hair loss. However, her other antibodies are neg. and her complement levels are good. I suspect that this is not a significant finding. I will check for inflammatory markers. I will refer her to rheumatology for an assessment.

## 2023-07-31 NOTE — Progress Notes (Signed)
[facility] [address] [place] [address] Dept: [phone] Dept Fax: [phone]  New Patient Office Visit  Subjective:    Patient ID: Lauren Larson, female    DOB: 11-14-1990, 32 y.o..   MRN: [id number]  Chief Complaint  Patient presents with   Establish Care    NP- establish care.     History of Present Illness:  Patient is in today to establish care. Lauren Larson was born in [place]. 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 She is engaged, but has not set a date. She has no children. She denies use of tobacco. She drinks about once a week, typically up to 5 alcohol beverages. She admits to smoking marijuana about 4 days a week.  Lauren Larson notes she had been seen in Urgent Care recently with several concerns. She had been experiencing feeling like she was run down, with a lack of energy. She noted issues with significant hair loss, though she ahs not had any areas of balding. She was noted by the UC provider to have areas of redness on her cheeks and forehead. She notes this has been present for some time and does worsen in the sun. She had testing done to evaluate for lupus and had a positive ANA. She also notes issues at times with lightheadedness and her mother notes she passed out last week.  Lauren Larson notes an issue with chronic watery diarrhea for the past 3 months.   Lauren Larson has been told recently at several visits that sh had an elevated blood pressure.  Past Medical History: Patient Active Problem List   Diagnosis Date Noted   Menorrhagia 07/31/2023   Positive ANA (antinuclear antibody) 07/31/2023   Malar rash 07/31/2023   Hair loss 07/31/2023   Chronic diarrhea 07/31/2023   Elevated blood-pressure reading without diagnosis of hypertension 07/31/2023   Cherry angioma 07/31/2023   Seasonal allergic rhinitis 07/31/2023   Past Surgical History:  Procedure Laterality Date   ADENOIDECTOMY     WISDOM  TOOTH EXTRACTION     Family History  Problem Relation Age of Onset   Heart disease Mother    Diabetes Mother    Fibromyalgia Mother    Heart disease Maternal Aunt        History of Stents   Diabetes Maternal Grandmother    Heart disease Maternal Grandmother        History of Stints   Heart disease Maternal Grandfather    Diabetes Maternal Grandfather    Outpatient Medications Prior to Visit  Medication Sig Dispense Refill   fluticasone (FLONASE) 50 MCG/ACT nasal spray Place 1 spray into both nostrils daily. 16 g 0   amoxicillin-clavulanate (AUGMENTIN) 875-125 MG tablet Take 1 tablet by mouth every 12 (twelve) hours. 14 tablet 0   cetirizine (ZYRTEC) 10 MG tablet Take 1 tablet (10 mg total) by mouth daily. 30 tablet 0   ibuprofen (ADVIL) 600 MG tablet Take 1 tablet (600 mg total) by mouth every 6 (six) hours as needed. 30 tablet 0   methocarbamol (ROBAXIN) 500 MG tablet Take 1 tablet (500 mg total) by mouth at bedtime as needed for muscle spasms. 20 tablet 0   promethazine-dextromethorphan (PROMETHAZINE-DM) 6.25-15 MG/5ML syrup Take 5 mLs by mouth 4 (four) times daily as needed for cough. 118 mL 0   No facility-administered medications prior to visit.   No Known Allergies Objective:   Today's Vitals   07/31/23 1042  BP: 122/80  Pulse: (!) 108  Temp: (!) 97 F (36.1 C)  TempSrc: Temporal  SpO2: 100%  Weight: 185 lb 6.4 oz (84.1 kg)  Height: 5' 3.5" (1.613 m)   Body mass index is 32.33 kg/m.   General: Well developed, well nourished. No acute distress. Skin: Warm and dry. There are generalized areas of redness/flushing of the skin of the cheeks and forehead (malar   pattern) Psych: Alert and oriented. Normal mood and affect.  Health Maintenance Due  Topic Date Due   HIV Screening  Never done   Hepatitis C Screening  Never done   DTaP/Tdap/Td (1 - Tdap) Never done   Cervical Cancer Screening (HPV/Pap Cotest)  Never done   INFLUENZA VACCINE  Never done   Lab Results  (07/07/2023)  ANA:     Positive, 1:320, speckled pattern Anti-dsDNA:   <8.0 IU/mL Anti-Sm Ab:   < 20 Units Anti-U1 RNP Ab:  < 20 Units Anti-Ro (SS-A):  < 20 Units Anti-La (SS-B):  < 20 Units Anit-Chromatin Ab,IgG: < 20 Units C3 Complement:  172 mg/dL (H) C4 Complement:  29 mg/dL  TSH: 5.28 mU/L  Comprehensive Metabolic Panel  Sodium: 139 mEq/L  Potassium: 4.3 mEq/L  Chloride: 104 mEq/L  Glucose: 96 mg/dL  BUN:  14 mg/dL  Creatinine: 4.13 mg/dL  eGFR:  244 WN/UUV/2.53G6  Complete Blood Count:  WBC:  6.2 K/uL  RBC:  4.12 M/mm3  Hemoglobin: 12.1 gm/dL  Hematocrit: 44.0 %  MCV:  91 fL  MCH:  29.4 pg  MCHC: 32.4 g/dL  RDW:  34.7 fL  Platelets: 223 K/cumm  Assessment & Plan:   Problem List Items Addressed This Visit       Cardiovascular and Mediastinum   Cherry angioma    Multiple benign skin lesions, but patient requests dermatology referral for a skin survey.      Relevant Orders   Ambulatory referral to Dermatology     Digestive   Chronic diarrhea    Etiology is not clear. Recent CBC and CMP are normal. I will check some additional stool studies. If negative, consider GI referral.      Relevant Orders   Gliadin antibodies, serum   Tissue transglutaminase, IgA   Reticulin Antibody, IgA w reflex titer   Calprotectin, Fecal     Musculoskeletal and Integument   Malar rash    There are areas of redness to the cheeks and forehead in a malar pattern. In light of the positive ANA and hair loss, this could be a sign of lupus.      Relevant Orders   Ambulatory referral to Rheumatology   Ambulatory referral to Dermatology     Other   Elevated blood-pressure reading without diagnosis of hypertension    I recommend we monitor her BP at home. It was in reasonable control today in the office.      Hair loss    Lauren Larson shared a photograph of a large amount of hair that she has had come out when she brushes. At this point, there is no clear sign of alopecia.  Recent thyroid testing was normal.      Relevant Orders   Ambulatory referral to Rheumatology   Ambulatory referral to Dermatology   Menorrhagia    I will refer her to GYN for management of menorrhagia and her routine cervical cancer screening.      Relevant Orders   Ambulatory referral to Obstetrics / Gynecology   Positive ANA (antinuclear antibody)    The ANA is  significantly high. Ms. Giudice has some facial rash and hair loss. However, her other antibodies are neg. and her complement levels are good. I suspect that this is not a significant finding. I will check for inflammatory markers. I will refer her to rheumatology for an assessment.      Relevant Orders   Sedimentation rate   C-reactive protein   Ambulatory referral to Rheumatology   Other Visit Diagnoses     Need for Tdap vaccination    -  Primary   Relevant Orders   Tdap vaccine greater than or equal to 7yo IM (Completed)   Screening for lipid disorders       Relevant Orders   Lipid panel   Encounter for hepatitis C screening test for low risk patient       Relevant Orders   HCV Ab w Reflex to Quant PCR   Need for immunization against influenza       Relevant Orders   Flu vaccine trivalent PF, 6mos and older(Flulaval,Afluria,Fluarix,Fluzone) (Completed)       Return in about 4 weeks (around 08/28/2023) for Reassessment.   Loyola Mast, MD

## 2023-07-31 NOTE — Assessment & Plan Note (Signed)
I will refer her to GYN for management of menorrhagia and her routine cervical cancer screening.

## 2023-07-31 NOTE — Assessment & Plan Note (Signed)
I recommend we monitor her BP at home. It was in reasonable control today in the office.

## 2023-07-31 NOTE — Assessment & Plan Note (Addendum)
There are areas of redness to the cheeks and forehead in a malar pattern. In light of the positive ANA and hair loss, this could be a sign of lupus.

## 2023-07-31 NOTE — Assessment & Plan Note (Signed)
Multiple benign skin lesions, but patient requests dermatology referral for a skin survey.

## 2023-08-01 LAB — GLIADIN ANTIBODIES, SERUM
Gliadin IgA: <1 U/mL
Gliadin IgG: <1 U/mL

## 2023-08-01 LAB — TISSUE TRANSGLUTAMINASE, IGA: (tTG) Ab, IgA: <1 U/mL

## 2023-08-05 ENCOUNTER — Ambulatory Visit: Payer: Medicaid Other | Admitting: Nurse Practitioner

## 2023-08-05 LAB — HCV AB W REFLEX TO QUANT PCR: HCV Ab: NONREACTIVE

## 2023-08-05 LAB — HCV INTERPRETATION

## 2023-08-05 LAB — RETICULIN ANTIBODIES, IGA W TITER

## 2023-08-28 ENCOUNTER — Ambulatory Visit: Payer: Medicaid Other | Admitting: Family Medicine

## 2023-08-28 ENCOUNTER — Other Ambulatory Visit: Payer: Self-pay | Admitting: Family Medicine

## 2023-08-28 VITALS — BP 132/80 | HR 84 | Temp 98.6°F | Ht 63.5 in | Wt 185.0 lb

## 2023-08-28 DIAGNOSIS — Z8742 Personal history of other diseases of the female genital tract: Secondary | ICD-10-CM

## 2023-08-28 DIAGNOSIS — Z9889 Other specified postprocedural states: Secondary | ICD-10-CM | POA: Diagnosis not present

## 2023-08-28 DIAGNOSIS — K529 Noninfective gastroenteritis and colitis, unspecified: Secondary | ICD-10-CM

## 2023-08-28 DIAGNOSIS — R030 Elevated blood-pressure reading, without diagnosis of hypertension: Secondary | ICD-10-CM

## 2023-08-28 DIAGNOSIS — N920 Excessive and frequent menstruation with regular cycle: Secondary | ICD-10-CM | POA: Diagnosis not present

## 2023-08-28 NOTE — Assessment & Plan Note (Signed)
Patient notes the GYN office she was referred to was unable to schedule her at the current time due to her Medicaid. She was instructed to call back in January to see if they could schedule her then. I will resend her referral and ask that she be placed with a practice that will accept her Medicaid.

## 2023-08-28 NOTE — Progress Notes (Signed)
[facility] [address] [place] [address] Dept: [phone] Dept Fax: [phone]  Chronic Care Office Visit  Subjective:    Patient ID: Lauren Larson, female    DOB: [birth year], 32 y.o..   MRN: [id number]  Chief Complaint  Patient presents with   Follow-up    4 week f/u.     History of Present Illness:  Patient is in today for reassessment of chronic medical issues.  At her initial visit 1 month ago, Lauren Larson noted feeling like she was run down, with a lack of energy. She noted issues with significant hair loss, areas of redness on her cheeks and forehead and having had testing done to evaluate for lupus and a positive ANA.    Lauren Larson has a history of chronic watery diarrhea for the past 3 months.    Lauren Larson has been told recently at several visits that she had an elevated blood pressure. She is trying to reduce her alcohol, caffeine and salt intake.  Past Medical History: Patient Active Problem List   Diagnosis Date Noted   History of cervical polypectomy 08/28/2023   Menorrhagia 07/31/2023   Positive ANA (antinuclear antibody) 07/31/2023   Malar rash 07/31/2023   Hair loss 07/31/2023   Chronic diarrhea 07/31/2023   Elevated blood-pressure reading without diagnosis of hypertension 07/31/2023   Cherry angioma 07/31/2023   Seasonal allergic rhinitis 07/31/2023   Marijuana use 07/31/2023   Past Surgical History:  Procedure Laterality Date   ADENOIDECTOMY     WISDOM TOOTH EXTRACTION     Family History  Problem Relation Age of Onset   Heart disease Mother    Diabetes Mother    Fibromyalgia Mother    Heart disease Maternal Aunt        History of Stents   Diabetes Maternal Grandmother    Heart disease Maternal Grandmother        History of Stints   Heart disease Maternal Grandfather    Diabetes Maternal Grandfather    Outpatient Medications Prior to Visit  Medication Sig Dispense Refill    fluticasone (FLONASE) 50 MCG/ACT nasal spray Place 1 spray into both nostrils daily. 16 g 0   No facility-administered medications prior to visit.   No Known Allergies Objective:   Today's Vitals   08/28/23 1320  BP: 132/80  Pulse: 84  Temp: 98.6 F (37 C)  TempSrc: Temporal  SpO2: 96%  Weight: 185 lb (83.9 kg)  Height: 5' 3.5" (1.613 m)   Body mass index is 32.26 kg/m.   General: Well developed, well nourished. No acute distress. Psych: Alert and oriented. Normal mood and affect.  Health Maintenance Due  Topic Date Due   HIV Screening  Never done   Cervical Cancer Screening (HPV/Pap Cotest)  Never done   Lab Results Last lipids Lab Results  Component Value Date   CHOL 161 07/31/2023   HDL 52.50 07/31/2023   LDLCALC 93 07/31/2023   TRIG 79.0 07/31/2023   CHOLHDL 3 07/31/2023      Component Value Date/Time   CRP <1.0 07/31/2023 1140   Lab Results  Component Value Date   ESRSEDRATE 18 07/31/2023     Assessment & Plan:   Problem List Items Addressed This Visit       Digestive   Chronic diarrhea - Primary    Stool studies have not revealed an underlying cause for chronic diarrhea. We discussed that this may represent IBS-D. Due to the long-standing nature,  I will refer her to GI to consider colonoscopy. Recommend she consider adding in a fiber supplement to bulk her stool.      Relevant Orders   Ambulatory referral to Gastroenterology     Other   Elevated blood-pressure reading without diagnosis of hypertension    BP remains borderline high. Reiterated nonpharmacologic approaches to lower BP. We will continue to monitor for now.      History of cervical polypectomy    Refer to GYN.      Relevant Orders   Ambulatory referral to Obstetrics / Gynecology   Menorrhagia    Patient notes the GYN office she was referred to was unable to schedule her at the current time due to her Medicaid. She was instructed to call back in January to see if they could  schedule her then. I will resend her referral and ask that she be placed with a practice that will accept her Medicaid.      Relevant Orders   Ambulatory referral to Obstetrics / Gynecology    Return in about 5 months (around 01/26/2024).   Loyola Mast, MD

## 2023-08-28 NOTE — Assessment & Plan Note (Addendum)
Stool studies have not revealed an underlying cause for chronic diarrhea. We discussed that this may represent IBS-D. Due to the long-standing nature, I will refer her to GI to consider colonoscopy. Recommend she consider adding in a fiber supplement to bulk her stool.

## 2023-08-28 NOTE — Assessment & Plan Note (Signed)
BP remains borderline high. Reiterated nonpharmacologic approaches to lower BP. We will continue to monitor for now.

## 2023-08-28 NOTE — Assessment & Plan Note (Signed)
Refer to GYN. 

## 2023-09-01 LAB — CALPROTECTIN, FECAL: Calprotectin, Fecal: 6 ug/g (ref 0–120)

## 2023-10-19 ENCOUNTER — Encounter: Payer: Medicaid Other | Admitting: Licensed Practical Nurse

## 2023-10-20 ENCOUNTER — Encounter: Payer: Self-pay | Admitting: Gastroenterology

## 2023-10-21 ENCOUNTER — Ambulatory Visit (INDEPENDENT_AMBULATORY_CARE_PROVIDER_SITE_OTHER): Payer: Medicaid Other

## 2023-10-21 ENCOUNTER — Other Ambulatory Visit (HOSPITAL_COMMUNITY)
Admission: RE | Admit: 2023-10-21 | Discharge: 2023-10-21 | Disposition: A | Discharge status: Home / Self Care | Payer: Medicaid Other | Source: Ambulatory Visit

## 2023-10-21 VITALS — BP 129/84 | HR 83 | Ht 63.5 in | Wt 186.0 lb

## 2023-10-21 DIAGNOSIS — Z975 Presence of (intrauterine) contraceptive device: Secondary | ICD-10-CM | POA: Diagnosis not present

## 2023-10-21 DIAGNOSIS — N920 Excessive and frequent menstruation with regular cycle: Secondary | ICD-10-CM

## 2023-10-21 DIAGNOSIS — Z7689 Persons encountering health services in other specified circumstances: Secondary | ICD-10-CM

## 2023-10-21 DIAGNOSIS — Z124 Encounter for screening for malignant neoplasm of cervix: Secondary | ICD-10-CM | POA: Diagnosis present

## 2023-10-21 MED ORDER — MISOPROSTOL 200 MCG PO TABS: ORAL_TABLET | ORAL | 2 refills | Status: DC

## 2023-10-21 NOTE — Progress Notes (Addendum)
   GYN ENCOUNTER  Encounter for AUB  Subjective  HPI: Lauren Larson is a 33 y.o. G0P0000 who presents today for heavy periods and cramping.   Has had heavy periods with heavy cramping for years. More recently having cramping throughout the month.  Having regular monthly cycles that last 4 days. Has to wear adult diapers during period to prevent leakage and also has started passing large clots. Currently sexually active with female partner. Does not have breakthrough bleeding between cycles.   Had polyps removed in 2020 and TVUS for which she cannot remember the results but thinks there was something she needed to follow up on and never did.   Past Medical History:  Diagnosis Date   Allergy    H/O cervical polypectomy    Past Surgical History:  Procedure Laterality Date   ADENOIDECTOMY     WISDOM TOOTH EXTRACTION     OB History     Gravida  0   Para  0   Term  0   Preterm  0   AB  0   Living  0      SAB  0   IAB  0   Ectopic  0   Multiple  0   Live Births  0          No Known Allergies  Review of Systems  12 point ROS negative except for pertinent positives noted in HPO above.   Objective  BP 129/84   Pulse 83   Ht 5' 3.5 (1.613 m)   Wt 186 lb (84.4 kg)   BMI 32.43 kg/m   Physical examination GENERAL APPEARANCE: alert, well appearing Pelvic exam: normal external genitalia, vulva, vagina, cervix, uterus and adnexa.   Assessment/Plan - Reviewed normal pelvic exam today. Pap smear collected during exam as she is due. - Ordered labs to rule out hormonal causes of HMB including TSH and Von Willebrand's panel.  - TVUS ordered to rule out structural abnormalities and follow up from previous US . - Discussed options for managing HMB including use of hormonal contraception. She desires to avoid estrogen-containing methods as her mother had an issue with blood clots when she used them previously. We reviewed the progesterone-only options including  IUD, Nexplanon, and pills. After this discussion, she desires the Mirena IUD. Discussed placement during or at the end of her cycle. Provided with Rx for misoprostol  and instructions for use. Will schedule in future when she is ready.   [name] [name], CNM  10/21/23 9:17 AM

## 2023-10-23 LAB — CYTOLOGY - PAP
Comment: NEGATIVE
Diagnosis: NEGATIVE
High risk HPV: NEGATIVE

## 2023-10-23 LAB — TSH+FREE T4
Free T4: 1.08 ng/dL (ref 0.82–1.77)
TSH: 5.55 u[IU]/mL — ABNORMAL HIGH (ref 0.450–4.500)

## 2023-10-23 LAB — VON WILLEBRAND PANEL
Factor VIII Activity: 202 % — ABNORMAL HIGH (ref 56–140)
Von Willebrand Ag: 154 % (ref 50–200)
Von Willebrand Factor: 131 % (ref 50–200)

## 2023-10-23 LAB — COAG STUDIES INTERP REPORT

## 2023-10-29 ENCOUNTER — Ambulatory Visit: Payer: Medicaid Other

## 2023-10-31 ENCOUNTER — Telehealth: Payer: Medicaid Other | Admitting: Physician Assistant

## 2023-10-31 DIAGNOSIS — B001 Herpesviral vesicular dermatitis: Secondary | ICD-10-CM | POA: Diagnosis not present

## 2023-10-31 MED ORDER — VALACYCLOVIR HCL 1 G PO TABS: 2000.0000 mg | ORAL_TABLET | Freq: Two times a day (BID) | ORAL | 0 refills | Status: AC

## 2023-10-31 NOTE — Progress Notes (Signed)
E-Visit for [employer]  We are sorry that you are not feeling well.  Here is how we plan to help!  Based on what you have shared with me it does look like you have a viral infection.    Most cold sores or fever blisters are small fluid filled blisters around the mouth caused by herpes simplex virus.  The most common strain of the virus causing cold sores is herpes simplex virus 1.  It can be spread by skin contact, sharing eating utensils, or even sharing towels.  Cold sores are contagious to other people until dry. (Approximately 5-7 days).  Wash your hands. You can spread the virus to your eyes through handling your contact lenses after touching the lesions.  Most people experience pain at the sight or tingling sensations in their lips that may begin before the ulcers erupt.  Herpes simplex is treatable but not curable.  It may lie dormant for a long time and then reappear due to stress or prolonged sun exposure.  Many patients have success in treating their cold sores with an over the counter topical called Abreva.  You may apply the cream up to 5 times daily (maximum 10 days) until healing occurs.  If you would like to use an oral antiviral medication to speed the healing of your cold sore, I have sent a prescription to your local pharmacy Valacyclovir 2 gm take one by mouth twice a day for 1 day    HOME CARE:  Wash your hands frequently. Do not pick at or rub the sore. Don't open the blisters. Avoid kissing other people during this time. Avoid sharing drinking glasses, eating utensils, or razors. Do not handle contact lenses unless you have thoroughly washed your hands with soap and warm water! Avoid oral sex during this time.  Herpes from sores on your mouth can spread to your partner's genital area. Avoid contact with anyone who has eczema or a weakened immune system. Cold sores are often triggered by exposure to intense sunlight, use a lip balm containing a sunscreen (SPF 30 or  higher).  GET HELP RIGHT AWAY IF:  Blisters look infected. Blisters occur near or in the eye. Symptoms last longer than 10 days. Your symptoms become worse.  MAKE SURE YOU:  Understand these instructions. Will watch your condition. Will get help right away if you are not doing well or get worse.    Your e-visit answers were reviewed by a board certified advanced clinical practitioner to complete your personal care plan.  Depending upon the condition, your plan could have  Included both over the counter or prescription medications.    Please review your pharmacy choice.  Be sure that the pharmacy you have chosen is open so that you can pick up your prescription now.  If there is a problem you can message your provider in MyChart to have the prescription routed to another pharmacy.    Your safety is important to [place].  If you have drug allergies check our prescription carefully.  For the next 24 hours you can use MyChart to ask questions about today's visit, request a non-urgent call back, or ask for a work or school excuse from your e-visit provider.  You will get an email in the next two days asking about your experience.  I hope that your e-visit has been valuable and will speed your recovery.

## 2023-10-31 NOTE — Progress Notes (Signed)
I have spent 5 minutes in review of e-visit questionnaire, review and updating patient chart, medical decision making and response to patient.   Laure Kidney, PA-C

## 2023-11-03 ENCOUNTER — Telehealth: Payer: Self-pay

## 2023-11-03 NOTE — Telephone Encounter (Signed)
Verified patient name and DOB. Reviewed recent lab results showing subclinical hypothyroidism and elevated FvIII activity. Recommended follow up with PCP for ongoing management.

## 2023-11-03 NOTE — Telephone Encounter (Signed)
Patient advised she has viewed her 10/21/23 lab results (Factor VIII & TSH) that are abnormal and would like to discuss. Advised will send to provider for review.

## 2023-11-13 ENCOUNTER — Ambulatory Visit (INDEPENDENT_AMBULATORY_CARE_PROVIDER_SITE_OTHER)
Admission: RE | Admit: 2023-11-13 | Discharge: 2023-11-13 | Disposition: A | Discharge status: Home / Self Care | Payer: Medicaid Other | Source: Ambulatory Visit | Attending: Gastroenterology | Admitting: Gastroenterology

## 2023-11-13 ENCOUNTER — Encounter: Payer: Self-pay | Admitting: Gastroenterology

## 2023-11-13 ENCOUNTER — Ambulatory Visit (INDEPENDENT_AMBULATORY_CARE_PROVIDER_SITE_OTHER): Payer: Medicaid Other | Admitting: Gastroenterology

## 2023-11-13 VITALS — BP 126/80 | HR 77 | Ht 63.0 in | Wt 183.2 lb

## 2023-11-13 DIAGNOSIS — R1030 Lower abdominal pain, unspecified: Secondary | ICD-10-CM

## 2023-11-13 DIAGNOSIS — K5909 Other constipation: Secondary | ICD-10-CM | POA: Diagnosis not present

## 2023-11-13 DIAGNOSIS — R195 Other fecal abnormalities: Secondary | ICD-10-CM

## 2023-11-13 NOTE — Progress Notes (Signed)
Chief Complaint: Change in bowel habits Primary GI MD: Gentry Fitz  HPI: 33 year old female with medical history as listed below presents for evaluation of change in bowel habits  Seen by PCP 08/28/2023.  At that time she reported 3 months of watery diarrhea in addition to multiple other symptoms including hair loss and lack of energy and she is currently being evaluated for lupus w6ith a positive ANA  Negative celiac serology Fecal calprotectin 6 TSH elevated at 5.55  Discussed the use of AI scribe software for clinical note transcription with the patient, who gave verbal consent to proceed.  History of Present Illness   The patient presents with chronic constipation and diarrhea. She was referred by her primary doctor for further evaluation of gastrointestinal symptoms.  The patient has a lifelong history of constipation, typically having bowel movements once or twice a week. Since the end of August, she experienced a significant change in bowel habits, with persistent watery diarrhea lasting until the end of October.   Testing for celiac disease and fecal calprotectin were negative, indicating no inflammation in the colon. Thyroid function assessment revealed an elevated TSH level, suggesting hypothyroidism.  Currently, she experiences alternating episodes of diarrhea and constipation. Watery diarrhea occurs for about four days, followed by constipation lasting up to five days, during which she requires suppositories to facilitate bowel movements. She also experiences severe abdominal cramps, nausea, gas, and hemorrhoids that protrude and cause significant pain, though rarely bleed. A burning sensation during bowel movements, described as feeling like 'fire', suggests the presence of a fissure.  She has a history of using Miralax daily as prescribed by a previous doctor, which was effective when taken consistently. However, she has not used it regularly in recent years.      Past  Medical History:  Diagnosis Date   Allergy    H/O cervical polypectomy     Past Surgical History:  Procedure Laterality Date   ADENOIDECTOMY     CERVICAL POLYPECTOMY N/A 2021   WISDOM TOOTH EXTRACTION      Current Outpatient Medications  Medication Sig Dispense Refill   fluticasone (FLONASE) 50 MCG/ACT nasal spray Place 1 spray into both nostrils daily. 16 g 0   misoprostol (CYTOTEC) 200 MCG tablet Place two tablets in the vagina the night prior to your next clinic appointment (Patient not taking: Reported on 11/13/2023) 2 tablet 2   No current facility-administered medications for this visit.    Allergies as of 11/13/2023   (No Known Allergies)    Family History  Problem Relation Age of Onset   Heart disease Mother    Diabetes Mother    Fibromyalgia Mother    Heart disease Maternal Aunt        History of Stents   Diabetes Maternal Grandmother    Heart disease Maternal Grandmother        History of Stints   Heart disease Maternal Grandfather    Diabetes Maternal Grandfather     Social History   Socioeconomic History   Marital status: Significant Other    Spouse name: Not on file   Number of children: Not on file   Years of education: Not on file   Highest education level: 12th grade  Occupational History   Occupation: Leisure centre manager    Comment: Civil engineer, contracting  Tobacco Use   Smoking status: Never   Smokeless tobacco: Never  Vaping Use   Vaping status: Some Days   Substances: Nicotine  Substance and Sexual Activity  Alcohol use: Yes    Comment: About twice a week and on the weekends.  All types.   Drug use: Never   Sexual activity: Yes  Other Topics Concern   Not on file  Social History Narrative   Not on file   Social Drivers of Health   Financial Resource Strain: Low Risk  (07/30/2023)   Overall Financial Resource Strain (CARDIA)    Difficulty of Paying Living Expenses: Not hard at all  Food Insecurity: No Food Insecurity (07/30/2023)   Hunger  Vital Sign    Worried About Running Out of Food in the Last Year: Never true    Ran Out of Food in the Last Year: Never true  Transportation Needs: No Transportation Needs (07/30/2023)   PRAPARE - Administrator, Civil Service (Medical): No    Lack of Transportation (Non-Medical): No  Physical Activity: Insufficiently Active (07/30/2023)   Exercise Vital Sign    Days of Exercise per Week: 1 day    Minutes of Exercise per Session: 30 min  Stress: No Stress Concern Present (07/30/2023)   [employer] of Occupational Health - Occupational Stress Questionnaire    Feeling of Stress : Only a little  Social Connections: Unknown (07/30/2023)   Social Connection and Isolation Panel [NHANES]    Frequency of Communication with Friends and Family: More than three times a week    Frequency of Social Gatherings with Friends and Family: Twice a week    Attends Religious Services: Patient declined    Database administrator or Organizations: No    Attends Engineer, structural: Not on file    Marital Status: Living with partner  Intimate Partner Violence: Not on file    Review of Systems:    Constitutional: No weight loss, fever, chills, weakness or fatigue HEENT: Eyes: No change in vision               Ears, Nose, Throat:  No change in hearing or congestion Skin: No rash or itching Cardiovascular: No chest pain, chest pressure or palpitations   Respiratory: No SOB or cough Gastrointestinal: See HPI and otherwise negative Genitourinary: No dysuria or change in urinary frequency Neurological: No headache, dizziness or syncope Musculoskeletal: No new muscle or joint pain Hematologic: No bleeding or bruising Psychiatric: No history of depression or anxiety    Physical Exam:  Vital signs: BP 126/80   Pulse 77   Ht 5\' 3"  (1.6 m)   Wt 183 lb 4 oz (83.1 kg)   LMP 11/05/2023 (Exact Date)   SpO2 98%   BMI 32.46 kg/m   Constitutional: NAD, Well developed, Well  nourished, alert and cooperative Head:  Normocephalic and atraumatic. Eyes:   PEERL, EOMI. No icterus. Conjunctiva pink. Respiratory: Respirations even and unlabored. Lungs clear to auscultation bilaterally.   No wheezes, crackles, or rhonchi.  Cardiovascular:  Regular rate and rhythm. No peripheral edema, cyanosis or pallor.  Gastrointestinal:  Soft, nondistended, nontender. No rebound or guarding. Hypoactive bowel sounds. No appreciable masses or hepatomegaly. Rectal:  Not performed. Patient declined. Msk:  Symmetrical without gross deformities. Without edema, no deformity or joint abnormality.  Neurologic:  Alert and  oriented x4;  grossly normal neurologically.  Skin:   Dry and intact without significant lesions or rashes. Psychiatric: Oriented to person, place and time. Demonstrates good judgement and reason without abnormal affect or behaviors.   RELEVANT LABS AND IMAGING: CBC    Component Value Date/Time   WBC 13.7 (H) 08/24/2009  2056   RBC 3.86 08/24/2009 2056   HGB 12.1 08/24/2009 2056   HCT 35.3 (L) 08/24/2009 2056   PLT 264 08/24/2009 2056   MCV 91.5 08/24/2009 2056   MCHC 34.3 08/24/2009 2056   RDW 12.3 08/24/2009 2056   LYMPHSABS 2.6 08/24/2009 2056   MONOABS 0.4 08/24/2009 2056   EOSABS 0.0 08/24/2009 2056   BASOSABS 0.1 08/24/2009 2056    CMP     Component Value Date/Time   NA 138 08/24/2009 2056   K 3.8 08/24/2009 2056   CL 108 08/24/2009 2056   CO2 23 08/24/2009 2056   GLUCOSE 85 08/24/2009 2056   BUN 12 08/24/2009 2056   CREATININE 0.71 08/24/2009 2056   CALCIUM 9.5 08/24/2009 2056   PROT 8.1 08/24/2009 2056   ALBUMIN 4.1 08/24/2009 2056   AST 21 08/24/2009 2056   ALT 13 08/24/2009 2056   ALKPHOS 70 08/24/2009 2056   BILITOT 0.7 08/24/2009 2056   GFRNONAA NOT CALCULATED 08/24/2009 2056   GFRAA  08/24/2009 2056    NOT CALCULATED        The eGFR has been calculated using the MDRD equation. This calculation has not been validated in all  clinical situations. eGFR's persistently <60 mL/min signify possible Chronic Kidney Disease.     Assessment/Plan:      Chronic Constipation with likely Overflow Diarrhea History of lifelong constipation, recently worsened with episodes of diarrhea. Suspected overflow diarrhea due to severe constipation, possibly exacerbated by recent thyroid imbalance. No evidence of inflammatory bowel disease or celiac disease per diagnostics --Order abdominal x-ray to assess for constipation. --Start daily Miralax, adjust dose as needed based on response. --Plan to follow up in 2-3 weeks to assess response to treatment.  Hypothyroidism Recent TSH level elevated, indicating hypothyroidism. Possible contribution to worsening constipation. --Recommend follow-up with primary care provider for further evaluation and management of thyroid function.  Anal Fissure Reports of burning sensation during bowel movements, suggestive of anal fissure likely secondary to chronic constipation and straining. -Prescribe compound cream for anal fissure, to be applied three times daily for 6-8 weeks.  Follow-up Plan to call patient in 2-3 weeks to assess response to treatment. Schedule appointment for 3 months from now.      Lara Mulch [facility] 11/13/2023, 12:59 PM  Cc: Loyola Mast, MD

## 2023-11-13 NOTE — Patient Instructions (Addendum)
Start taking Miralax 1 capful (17 grams) 1x / day for 1 week.   If this is not effective, increase to 1 dose 2x / day for 1 week.   If this is still not effective, increase to two capfuls (34 grams) 2x / day.   Can adjust dose as needed based on response. Can take 1/2 cap daily, skip days, or increase per day.     Your provider has requested that you have an abdominal x ray before leaving today. Please go to the basement floor to our Radiology department for the test.   Due to recent changes in healthcare laws, you may see the results of your imaging and laboratory studies on MyChart before your provider has had a chance to review them.  We understand that in some cases there may be results that are confusing or concerning to you. Not all laboratory results come back in the same time frame and the provider may be waiting for multiple results in order to interpret others.  Please give [place] 48 hours in order for your provider to thoroughly review all the results before contacting the office for clarification of your results.    I appreciate the  opportunity to care for you  Thank You   [facility]

## 2023-11-13 NOTE — Progress Notes (Signed)
I agree with the assessment and plan outlined by Ms. McMichael.

## 2023-11-16 ENCOUNTER — Encounter: Payer: Self-pay | Admitting: Family Medicine

## 2023-11-16 ENCOUNTER — Ambulatory Visit: Payer: Medicaid Other | Admitting: Family Medicine

## 2023-11-16 VITALS — BP 118/74 | HR 71 | Temp 98.2°F | Ht 63.0 in | Wt 183.6 lb

## 2023-11-16 DIAGNOSIS — E038 Other specified hypothyroidism: Secondary | ICD-10-CM | POA: Insufficient documentation

## 2023-11-16 DIAGNOSIS — B009 Herpesviral infection, unspecified: Secondary | ICD-10-CM | POA: Diagnosis not present

## 2023-11-16 DIAGNOSIS — Z8249 Family history of ischemic heart disease and other diseases of the circulatory system: Secondary | ICD-10-CM | POA: Insufficient documentation

## 2023-11-16 DIAGNOSIS — R791 Abnormal coagulation profile: Secondary | ICD-10-CM | POA: Diagnosis not present

## 2023-11-16 MED ORDER — VALACYCLOVIR HCL 1 G PO TABS: 2000.0000 mg | ORAL_TABLET | Freq: Two times a day (BID) | ORAL | 1 refills | Status: AC

## 2023-11-16 NOTE — Progress Notes (Signed)
[facility] [address] [place] [address] Dept: [phone] Dept Fax: [phone]  Office Visit  Subjective:    Patient ID: Lauren Larson, female    DOB: 1991-06-09, 33 y.o..   MRN: [id number]  Chief Complaint  Patient presents with   Follow-up    F/u to discuss abnormal Thyroid labs done by OB-GYN.    History of Present Illness:  Patient is in today for follow-up regarding lab testing performed by her GYN. She was being seen regarding menorrhagia issues.  Ms. Knezevic was found to have a mildly elevated TSH with a normal free T4. She admits to issues of weight gain, dry skin, brittle nails, hair loss and constipation, though she denies cold intolerance, noting she feels hot all of the time.  Ms. Greeson has heavy periods. She notes her mother has had issues with blood clots. The GYN was concerned about safety of prescribing COCs to help reduce her menstrual blood loss.  Ms. Voth notes she had a recent outbreak of cold sores. She was prescribed an antiviral that seemed to help. She notes she gets these twice a year. She would like to have medicine on hand to treat when this occurs.  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   History of cervical polypectomy 08/28/2023   Menorrhagia 07/31/2023   Positive ANA (antinuclear antibody) 07/31/2023   Malar rash 07/31/2023   Hair loss 07/31/2023   Chronic diarrhea 07/31/2023   Elevated blood-pressure reading without diagnosis of hypertension 07/31/2023   Cherry angioma 07/31/2023   Seasonal allergic rhinitis 07/31/2023   Marijuana use 07/31/2023   Past Surgical History:  Procedure Laterality Date   ADENOIDECTOMY     CERVICAL POLYPECTOMY N/A 2021   WISDOM TOOTH EXTRACTION     Family History  Problem  Relation Age of Onset   Heart disease Mother    Diabetes Mother    Fibromyalgia Mother    Heart disease Maternal Aunt        History of Stents   Diabetes Maternal Grandmother    Heart disease Maternal Grandmother        History of Stints   Heart disease Maternal Grandfather    Diabetes Maternal Grandfather    Outpatient Medications Prior to Visit  Medication Sig Dispense Refill   fluticasone (FLONASE) 50 MCG/ACT nasal spray Place 1 spray into both nostrils daily. 16 g 0   misoprostol (CYTOTEC) 200 MCG tablet Place two tablets in the vagina the night prior to your next clinic appointment 2 tablet 2   No facility-administered medications prior to visit.   No Known Allergies   Objective:   Today's Vitals   11/16/23 0854  BP: 118/74  Pulse: 71  Temp: 98.2 F (36.8 C)  TempSrc: Temporal  SpO2: 98%  Weight: 183 lb 9.6 oz (83.3 kg)  Height: 5\' 3"  (1.6 m)   Body mass index is 32.52 kg/m.   General: Well developed, well nourished. No acute distress. Psych: Alert and oriented. Normal mood and affect.  Health Maintenance Due  Topic Date Due   HIV Screening  Never done   Lab Results: Component Ref Range & Units (hover) 3 wk ago  TSH 5.550 High   Free T4 1.08   Component Ref Range & Units (hover) 3 wk ago  Factor VIII  Activity 202 High   Von Willebrand Ag 154  Von Willebrand Factor 131     Assessment & Plan:   Problem List Items Addressed This Visit       Endocrine   Subclinical hypothyroidism - Primary   Mildly elevated TSH with normal free T4 consistent with subclinical hypothyroidism. Discussed lack of evidence for short term benefit for thyroid hormone replacement for those with subclinical hypothyroidism. Increased lifetime risk of developing overt hypothyroidism. I recommend expectant management for now with a repeat TSH and T4 in 6-8 weeks.        Other   Elevated factor VIII level   Increased Factor VIII activity can increase risk for thromboembolic  disorders. Patient does have a positive family history of DVT. I will have her see hematology to further evaluate and counsel on risks for taking oral contraceptives.      Relevant Orders   Ambulatory referral to Hematology / Oncology   Family history of DVT   Relevant Orders   Ambulatory referral to Hematology / Oncology   HSV-1 infection   I will provide valacyclovir for acute cold sore management.      Relevant Medications   valACYclovir (VALTREX) 1000 MG tablet    Return in about 2 months (around 01/14/2024) for Reassessment.   Loyola Mast, MD

## 2023-11-16 NOTE — Assessment & Plan Note (Signed)
Increased Factor VIII activity can increase risk for thromboembolic disorders. Patient does have a positive family history of DVT. I will have her see hematology to further evaluate and counsel on risks for taking oral contraceptives.

## 2023-11-16 NOTE — Assessment & Plan Note (Signed)
I will provide valacyclovir for acute cold sore management.

## 2023-11-16 NOTE — Assessment & Plan Note (Signed)
Mildly elevated TSH with normal free T4 consistent with subclinical hypothyroidism. Discussed lack of evidence for short term benefit for thyroid hormone replacement for those with subclinical hypothyroidism. Increased lifetime risk of developing overt hypothyroidism. I recommend expectant management for now with a repeat TSH and T4 in 6-8 weeks.

## 2023-11-25 ENCOUNTER — Ambulatory Visit: Payer: Medicaid Other

## 2023-11-25 DIAGNOSIS — N920 Excessive and frequent menstruation with regular cycle: Secondary | ICD-10-CM | POA: Diagnosis not present

## 2023-12-07 ENCOUNTER — Ambulatory Visit: Payer: Medicaid Other

## 2023-12-22 ENCOUNTER — Inpatient Hospital Stay: Payer: Medicaid Other | Attending: Nurse Practitioner | Admitting: Nurse Practitioner

## 2023-12-22 ENCOUNTER — Inpatient Hospital Stay: Payer: Medicaid Other

## 2023-12-22 VITALS — BP 102/70 | HR 91 | Temp 98.2°F | Resp 17 | Wt 179.6 lb

## 2023-12-22 DIAGNOSIS — Z7289 Other problems related to lifestyle: Secondary | ICD-10-CM | POA: Diagnosis not present

## 2023-12-22 DIAGNOSIS — Z9089 Acquired absence of other organs: Secondary | ICD-10-CM | POA: Insufficient documentation

## 2023-12-22 DIAGNOSIS — F17290 Nicotine dependence, other tobacco product, uncomplicated: Secondary | ICD-10-CM | POA: Insufficient documentation

## 2023-12-22 DIAGNOSIS — Z8269 Family history of other diseases of the musculoskeletal system and connective tissue: Secondary | ICD-10-CM | POA: Insufficient documentation

## 2023-12-22 DIAGNOSIS — E663 Overweight: Secondary | ICD-10-CM | POA: Diagnosis not present

## 2023-12-22 DIAGNOSIS — Z8249 Family history of ischemic heart disease and other diseases of the circulatory system: Secondary | ICD-10-CM | POA: Insufficient documentation

## 2023-12-22 DIAGNOSIS — Z79899 Other long term (current) drug therapy: Secondary | ICD-10-CM | POA: Insufficient documentation

## 2023-12-22 DIAGNOSIS — K5900 Constipation, unspecified: Secondary | ICD-10-CM | POA: Insufficient documentation

## 2023-12-22 DIAGNOSIS — D6800 Von Willebrand disease, unspecified: Secondary | ICD-10-CM | POA: Insufficient documentation

## 2023-12-22 DIAGNOSIS — Z833 Family history of diabetes mellitus: Secondary | ICD-10-CM | POA: Diagnosis not present

## 2023-12-22 NOTE — Progress Notes (Cosign Needed)
 [facility]  [phone]   HEMATOLOGY ONCOLOGY CONSULTATION   Lauren Larson  DOB: 1991-04-22  MR#: [id number]  CSN#: [id number]     Patient Care Team: Loyola Mast, MD as PCP - General (Family Medicine)  History of present illness:    The patient was referred from primary care.  She had a history of heavy menstrual period most and was discussing potential oral contraceptives.  When reporting her mother had history of blood clots while taking estrogen, additional labs were done including screening for von Willebrand's disease.  Her labs did indicate factor VIII activity increased at 202, which increases risk for DVT and recurrent DVTs.  It is not however, a positive for von Willebrand's disease. The patient's main physical complaint is severe constipation.  Is been an issue for most of her life.  She denies presence of blood in her stool.  Denies nausea and vomiting.  Denies throwing up blood.  She denies abnormal bruising.  Denies epistaxis. She does have family history of heart disease stemming from her maternal grandmother and grandfather, multiple aunts, brother, and uncle.  She denies family history of cancer or known bleeding or clotting disorders. The patient is single and lives with her significant other along with 2 dogs.  She reports vaping every day.  She does consume alcohol.  States she drinks 2 days/week.  She does work.    MEDICAL HISTORY:  Past Medical History:  Diagnosis Date   Allergy    H/O cervical polypectomy     SURGICAL HISTORY: Past Surgical History:  Procedure Laterality Date   ADENOIDECTOMY     CERVICAL POLYPECTOMY N/A 2021   WISDOM TOOTH EXTRACTION      SOCIAL HISTORY: Social History   Socioeconomic History   Marital status: Significant Other    Spouse name: Not on file   Number of children: Not on file   Years of education: Not on file   Highest education level: 12th grade  Occupational History   Occupation: Leisure centre manager     Comment: Civil engineer, contracting  Tobacco Use   Smoking status: Never   Smokeless tobacco: Never  Vaping Use   Vaping status: Some Days   Substances: Nicotine  Substance and Sexual Activity   Alcohol use: Yes    Comment: About twice a week and on the weekends.  All types.   Drug use: Never   Sexual activity: Yes  Other Topics Concern   Not on file  Social History Narrative   Not on file   Social Drivers of Health   Financial Resource Strain: Low Risk  (07/30/2023)   Overall Financial Resource Strain (CARDIA)    Difficulty of Paying Living Expenses: Not hard at all  Food Insecurity: No Food Insecurity (07/30/2023)   Hunger Vital Sign    Worried About Running Out of Food in the Last Year: Never true    Ran Out of Food in the Last Year: Never true  Transportation Needs: No Transportation Needs (07/30/2023)   PRAPARE - Administrator, Civil Service (Medical): No    Lack of Transportation (Non-Medical): No  Physical Activity: Insufficiently Active (07/30/2023)   Exercise Vital Sign    Days of Exercise per Week: 1 day    Minutes of Exercise per Session: 30 min  Stress: No Stress Concern Present (07/30/2023)   [employer] of Occupational Health - Occupational Stress Questionnaire    Feeling of Stress : Only a little  Social Connections: Unknown (  07/30/2023)   Social Connection and Isolation Panel [NHANES]    Frequency of Communication with Friends and Family: More than three times a week    Frequency of Social Gatherings with Friends and Family: Twice a week    Attends Religious Services: Patient declined    Database administrator or Organizations: No    Attends Engineer, structural: Not on file    Marital Status: Living with partner  Intimate Partner Violence: Not on file    FAMILY HISTORY: Family History  Problem Relation Age of Onset   Heart disease Mother    Diabetes Mother    Fibromyalgia Mother    Heart disease Maternal Aunt        History  of Stents   Diabetes Maternal Grandmother    Heart disease Maternal Grandmother        History of Stints   Heart disease Maternal Grandfather    Diabetes Maternal Grandfather     ALLERGIES:  has no known allergies.  MEDICATIONS:  Current Outpatient Medications  Medication Sig Dispense Refill   fluticasone (FLONASE) 50 MCG/ACT nasal spray Place 1 spray into both nostrils daily. 16 g 0   misoprostol (CYTOTEC) 200 MCG tablet Place two tablets in the vagina the night prior to your next clinic appointment 2 tablet 2   No current facility-administered medications for this visit.    REVIEW OF SYSTEMS:   Constitutional: Denies fevers, chills or abnormal night sweats Eyes: Denies blurriness of vision, double vision or watery eyes Ears, nose, mouth, throat, and face: Denies mucositis or sore throat Respiratory: Denies cough, dyspnea or wheezes Cardiovascular: Denies palpitation, chest discomfort or lower extremity swelling Gastrointestinal:  Denies nausea, heartburn or change in bowel habits.  Chronic and severe constipation. Skin: Denies abnormal skin rashes Lymphatics: Denies new lymphadenopathy or easy bruising Neurological:Denies numbness, tingling or new weaknesses Behavioral/Psych: Mood is stable, no new changes  All other systems were reviewed with the patient and are negative.  PHYSICAL EXAMINATION: ECOG PERFORMANCE STATUS: 1 - Symptomatic but completely ambulatory  Vitals:   12/22/23 1420  BP: 102/70  Pulse: 91  Resp: 17  Temp: 98.2 F (36.8 C)  SpO2: 97%   Filed Weights   12/22/23 1420  Weight: 179 lb 9.6 oz (81.5 kg)    GENERAL:alert, no distress and comfortable SKIN: skin color, texture, turgor are normal, no rashes or significant lesions EYES: normal, conjunctiva are pink and non-injected, sclera clear OROPHARYNX:no exudate, no erythema and lips, buccal mucosa, and tongue normal  NECK: supple, thyroid normal size, non-tender, without nodularity LYMPH:  no  palpable lymphadenopathy in the cervical, axillary or inguinal LUNGS: clear to auscultation and percussion with normal breathing effort HEART: regular rate & rhythm and no murmurs and no lower extremity edema ABDOMEN:abdomen soft, non-tender and normal bowel sounds Musculoskeletal:no cyanosis of digits and no clubbing  PSYCH: alert & oriented x 3 with fluent speech NEURO: no focal motor/sensory deficits  LABORATORY DATA:  I have reviewed the data as listed Lab Results  Component Value Date   WBC 13.7 (H) 08/24/2009   HGB 12.1 08/24/2009   HCT 35.3 (L) 08/24/2009   MCV 91.5 08/24/2009   PLT 264 08/24/2009     ASSESSMENT & PLAN:  Family history of thrombosis Assessment & Plan: Family history of thrombus.. -Hypercoagulable mobility workup showed increased factor VIII activity with negative genetics for von Willebrand's disease or factor V Leiden. -Repeat labs with hypercoagulability workup for confirmation.  Will contact patient with results.  Determine follow-up based on those results.  Orders: -     Factor 8 assay; Future     The patient was seen along with Dr. Mosetta Putt.  All questions were answered. The patient knows to call the clinic with any problems, questions or concerns. Time spent with the patient was approximately 30 minutes. This time included reviewing progress notes, labs, imaging studies, and discussing plan for follow up.        Carlean Jews, NP 12/27/2023 2:54 PM   Addendum I have seen the patient, examined her. I agree with the assessment and and plan and have edited the notes.   33 year old female with family history of PE in her mother when she was on birth control pills, was referred by her GYN for evaluation of her abnormal coagulopathy workup.  She had no history of thrombosis, does not smoke, but does vape, and is overweight.  No other risk of thrombosis.  I reviewed her hypercoagulopathy workup, not sure why VWD panel was ordered which showed normal VW  Ag and factor level, and elevated factor VIII level which is likely non-specific. No coagulopathy workup, such as prothrombin gene mutation, factor V Leiden mutation etc. due to his family history of thrombosis in her mother, and likelihood that she would go on birth control pill, I think it is reasonable to obtain hypercoagulopathy workup.  Will repeat her factor VIII level also.Will call her with the lab results.  Malachy Mood MD 12/22/2023

## 2023-12-24 ENCOUNTER — Other Ambulatory Visit: Payer: Self-pay

## 2023-12-27 NOTE — Assessment & Plan Note (Signed)
 Family history of thrombus.. -Hypercoagulable mobility workup showed increased factor VIII activity with negative genetics for von Willebrand's disease or factor V Leiden. -Repeat labs with hypercoagulability workup for confirmation.  Will contact patient with results.  Determine follow-up based on those results.

## 2023-12-29 ENCOUNTER — Telehealth: Payer: Self-pay | Admitting: Nurse Practitioner

## 2023-12-29 ENCOUNTER — Other Ambulatory Visit: Payer: Self-pay

## 2023-12-30 ENCOUNTER — Telehealth: Payer: Self-pay | Admitting: Nurse Practitioner

## 2024-01-01 ENCOUNTER — Inpatient Hospital Stay

## 2024-01-05 ENCOUNTER — Telehealth: Payer: Self-pay | Admitting: Nurse Practitioner

## 2024-01-05 NOTE — Telephone Encounter (Signed)
 Left Message - Informed patient to call to reschedule lab appt.

## 2024-01-08 ENCOUNTER — Encounter: Payer: Self-pay | Admitting: Internal Medicine

## 2024-01-08 ENCOUNTER — Ambulatory Visit: Payer: Medicaid Other | Attending: Internal Medicine | Admitting: Internal Medicine

## 2024-01-08 VITALS — BP 120/70 | HR 66 | Resp 14 | Ht 64.0 in | Wt 179.0 lb

## 2024-01-08 DIAGNOSIS — E038 Other specified hypothyroidism: Secondary | ICD-10-CM

## 2024-01-08 DIAGNOSIS — R21 Rash and other nonspecific skin eruption: Secondary | ICD-10-CM | POA: Diagnosis not present

## 2024-01-08 DIAGNOSIS — R768 Other specified abnormal immunological findings in serum: Secondary | ICD-10-CM | POA: Diagnosis not present

## 2024-01-08 DIAGNOSIS — Z8249 Family history of ischemic heart disease and other diseases of the circulatory system: Secondary | ICD-10-CM

## 2024-01-08 NOTE — Progress Notes (Deleted)
 Chief Complaint: Constipation follow-up Primary GI MD: Dr. Leonides Schanz  HPI: 33 year old female with medical history as listed below presents for follow-up of constipation.  Seen 11/13/2023 with lifelong constipation and recent episodes of diarrhea suspected overflow with hypothyroidism contributing to worsening symptoms.  Also burning sensation of rectum suspicious for anal fissure Currently being evaluated for lupus with positive ANA Negative celiac serology Fecal calprotectin 6 TSH 5.55  KUB showed stool throughout colon consistent with constipation.  Recommended doing MiraLAX daily.  She was also given compound cream for fissure   Discussed the use of AI scribe software for clinical note transcription with the patient, who gave verbal consent to proceed.  History of Present Illness             PREVIOUS GI WORKUP     Past Medical History:  Diagnosis Date   Allergy    H/O cervical polypectomy     Past Surgical History:  Procedure Laterality Date   ADENOIDECTOMY     CERVICAL POLYPECTOMY N/A 2021   WISDOM TOOTH EXTRACTION      Current Outpatient Medications  Medication Sig Dispense Refill   Fexofenadine HCl (MUCINEX ALLERGY PO) Take by mouth.     fluticasone (FLONASE) 50 MCG/ACT nasal spray Place 1 spray into both nostrils daily. 16 g 0   misoprostol (CYTOTEC) 200 MCG tablet Place two tablets in the vagina the night prior to your next clinic appointment (Patient not taking: Reported on 01/08/2024) 2 tablet 2   Polyethylene Glycol 3350 (MIRALAX PO) Take by mouth.     valACYclovir (VALTREX) 1000 MG tablet Take 2,000 mg by mouth 2 (two) times daily. (Patient not taking: Reported on 01/08/2024)     No current facility-administered medications for this visit.    Allergies as of 01/11/2024   (No Known Allergies)    Family History  Problem Relation Age of Onset   Heart disease Mother    Diabetes Mother    Fibromyalgia Mother    Diabetes Maternal Grandmother    Heart  disease Maternal Grandmother        History of Stints   Heart disease Maternal Grandfather    Diabetes Maternal Grandfather    Heart disease Maternal Aunt        History of Stents   Fibromyalgia Maternal Aunt     Social History   Socioeconomic History   Marital status: Significant Other    Spouse name: Not on file   Number of children: Not on file   Years of education: Not on file   Highest education level: 12th grade  Occupational History   Occupation: Leisure centre manager    Comment: Civil engineer, contracting  Tobacco Use   Smoking status: Never    Passive exposure: Past   Smokeless tobacco: Never  Vaping Use   Vaping status: Some Days   Substances: Nicotine  Substance and Sexual Activity   Alcohol use: Yes    Comment: About twice a week and on the weekends.  All types.   Drug use: Never   Sexual activity: Yes  Other Topics Concern   Not on file  Social History Narrative   Not on file   Social Drivers of Health   Financial Resource Strain: Low Risk  (07/30/2023)   Overall Financial Resource Strain (CARDIA)    Difficulty of Paying Living Expenses: Not hard at all  Food Insecurity: No Food Insecurity (07/30/2023)   Hunger Vital Sign    Worried About Running Out of Food in the Last Year:  Never true    Ran Out of Food in the Last Year: Never true  Transportation Needs: No Transportation Needs (07/30/2023)   PRAPARE - Administrator, Civil Service (Medical): No    Lack of Transportation (Non-Medical): No  Physical Activity: Insufficiently Active (07/30/2023)   Exercise Vital Sign    Days of Exercise per Week: 1 day    Minutes of Exercise per Session: 30 min  Stress: No Stress Concern Present (07/30/2023)   [employer] of Occupational Health - Occupational Stress Questionnaire    Feeling of Stress : Only a little  Social Connections: Unknown (07/30/2023)   Social Connection and Isolation Panel [NHANES]    Frequency of Communication with Friends and Family: More  than three times a week    Frequency of Social Gatherings with Friends and Family: Twice a week    Attends Religious Services: Patient declined    Database administrator or Organizations: No    Attends Engineer, structural: Not on file    Marital Status: Living with partner  Intimate Partner Violence: Not on file    Review of Systems:    Constitutional: No weight loss, fever, chills, weakness or fatigue HEENT: Eyes: No change in vision               Ears, Nose, Throat:  No change in hearing or congestion Skin: No rash or itching Cardiovascular: No chest pain, chest pressure or palpitations   Respiratory: No SOB or cough Gastrointestinal: See HPI and otherwise negative Genitourinary: No dysuria or change in urinary frequency Neurological: No headache, dizziness or syncope Musculoskeletal: No new muscle or joint pain Hematologic: No bleeding or bruising Psychiatric: No history of depression or anxiety    Physical Exam:  Vital signs: LMP 01/01/2024   Constitutional: NAD, Well developed, Well nourished, alert and cooperative Head:  Normocephalic and atraumatic. Eyes:   PEERL, EOMI. No icterus. Conjunctiva pink. Respiratory: Respirations even and unlabored. Lungs clear to auscultation bilaterally.   No wheezes, crackles, or rhonchi.  Cardiovascular:  Regular rate and rhythm. No peripheral edema, cyanosis or pallor.  Gastrointestinal:  Soft, nondistended, nontender. No rebound or guarding. Normal bowel sounds. No appreciable masses or hepatomegaly. Rectal:  Not performed.  Msk:  Symmetrical without gross deformities. Without edema, no deformity or joint abnormality.  Neurologic:  Alert and  oriented x4;  grossly normal neurologically.  Skin:   Dry and intact without significant lesions or rashes. Psychiatric: Oriented to person, place and time. Demonstrates good judgement and reason without abnormal affect or behaviors.  Physical Exam           RELEVANT LABS AND  IMAGING: CBC    Component Value Date/Time   WBC 13.7 (H) 08/24/2009 2056   RBC 3.86 08/24/2009 2056   HGB 12.1 08/24/2009 2056   HCT 35.3 (L) 08/24/2009 2056   PLT 264 08/24/2009 2056   MCV 91.5 08/24/2009 2056   MCHC 34.3 08/24/2009 2056   RDW 12.3 08/24/2009 2056   LYMPHSABS 2.6 08/24/2009 2056   MONOABS 0.4 08/24/2009 2056   EOSABS 0.0 08/24/2009 2056   BASOSABS 0.1 08/24/2009 2056    CMP     Component Value Date/Time   NA 138 08/24/2009 2056   K 3.8 08/24/2009 2056   CL 108 08/24/2009 2056   CO2 23 08/24/2009 2056   GLUCOSE 85 08/24/2009 2056   BUN 12 08/24/2009 2056   CREATININE 0.71 08/24/2009 2056   CALCIUM 9.5 08/24/2009 2056   PROT  8.1 08/24/2009 2056   ALBUMIN 4.1 08/24/2009 2056   AST 21 08/24/2009 2056   ALT 13 08/24/2009 2056   ALKPHOS 70 08/24/2009 2056   BILITOT 0.7 08/24/2009 2056   GFRNONAA NOT CALCULATED 08/24/2009 2056   GFRAA  08/24/2009 2056    NOT CALCULATED        The eGFR has been calculated using the MDRD equation. This calculation has not been validated in all clinical situations. eGFR's persistently <60 mL/min signify possible Chronic Kidney Disease.     Assessment/Plan:   Assessment and Plan                Lara Mulch [facility] 01/08/2024, 3:40 PM  Cc: Loyola Mast, MD

## 2024-01-08 NOTE — Patient Instructions (Signed)
 I recommend checking out the [facility] [place] patient-centered guide for fibromyalgia and chronic pain management: [email or web address]

## 2024-01-08 NOTE — Progress Notes (Signed)
 Office Visit Note  Patient: Lauren Larson             Date of Birth: 1991/09/23           MRN: [id number]             PCP: Lauren Mast, MD Referring: Lauren Mast, MD Visit Date: 01/08/2024 Occupation: Bartender  Subjective:  New Patient (Initial Visit) (Patient states she has pain in her elbows and her knees. )   Discussed the use of AI scribe software for clinical note transcription with the patient, who gave verbal consent to proceed.  History of Present Illness   Lauren Larson is a 33 year old female who presents for evaluation of positive ANA and symptoms involving skin, joints, GI, and fatigue concern for inflammatory condition. She is accompanied by her mother who discusses an extensive history of fibromyalgia.  She has experienced rashes, joint pain, and fatigue for several years. A positive ANA test was noted in September of the previous year. She frequently experiences illness, including mouth sores and impetigo on her nose, and feels her immune system is compromised. Rashes, initially thought to be rosacea, have persisted for years. She reports generalized significant hair thinning.  Joint pain is particularly noted in her knees and elbows, with visible swelling in her knees after work. She describes a burning sensation in her shoulders, exacerbated by touch or clothing. She uses ibuprofen for pain relief, though its effectiveness is uncertain.  Severe digestive issues began last summer, characterized by extreme cramping, constipation, and diarrhea lasting up to two weeks, leading to increased work absences. She reports blood and mucus in her bowel movements, which she attributes to hemorrhoids. An x-ray indicated compaction, and she has a follow-up appointment scheduled. She has been advised to take Miralax but admits to inconsistent use.  She reports frequent swelling of her lymph nodes, particularly in the neck, and has a history of tonsil stones, which she attributes to  her swollen glands. She has not pursued a tonsillectomy due to fear of complications.  Excessive sweating since last summer, waking up drenched and sweating profusely at work, is a new development. She has had COVID-19 four times, with the last diagnosis about a year and a half ago.  Her family history includes fibromyalgia and rheumatoid arthritis, with her grandfather having had rheumatoid arthritis. Her sister had thyroid problems and underwent thyroid removal.    10/2023 Factor VIII 202% vWF wnl TSH 5.55 T4 1.08  07/2023 HCV neg Ttg, reticulin, gliadin Abs neg ESR 18 CRP <1  06/2023 ANA 1:320 speckled dsDNA, chromatin, SSA, SSB, Sm, UqRNP neg Complement C3 172 Complement C4 29  Activities of Daily Living:  Patient reports morning stiffness for 3 minutes.   Patient Reports nocturnal pain.  Difficulty dressing/grooming: Denies Difficulty climbing stairs: Denies Difficulty getting out of chair: Denies Difficulty using hands for taps, buttons, cutlery, and/or writing: Denies  Review of Systems  Constitutional:  Positive for fatigue.  HENT:  Negative for mouth sores and mouth dryness.   Eyes:  Positive for dryness.  Respiratory:  Negative for shortness of breath.   Cardiovascular:  Negative for chest pain and palpitations.  Gastrointestinal:  Positive for blood in stool, constipation and diarrhea.  Endocrine: Negative for increased urination.  Genitourinary:  Negative for involuntary urination.  Musculoskeletal:  Positive for joint pain, joint pain, joint swelling, myalgias, morning stiffness, muscle tenderness and myalgias. Negative for gait problem and muscle weakness.  Skin:  Positive  for rash, hair loss and sensitivity to sunlight. Negative for color change.  Allergic/Immunologic: Positive for susceptible to infections.  Neurological:  Positive for dizziness and headaches.  Hematological:  Positive for swollen glands.  Psychiatric/Behavioral:  Positive for sleep  disturbance. Negative for depressed mood. The patient is nervous/anxious.     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   History of cervical polypectomy 08/28/2023   Menorrhagia 07/31/2023   Positive ANA (antinuclear antibody) 07/31/2023   Malar rash 07/31/2023   Hair loss 07/31/2023   Chronic diarrhea 07/31/2023   Elevated blood-pressure reading without diagnosis of hypertension 07/31/2023   Cherry angioma 07/31/2023   Seasonal allergic rhinitis 07/31/2023   Marijuana use 07/31/2023    Past Medical History:  Diagnosis Date   Allergy    H/O cervical polypectomy     Family History  Problem Relation Age of Onset   Heart disease Mother    Diabetes Mother    Fibromyalgia Mother    Diabetes Maternal Grandmother    Heart disease Maternal Grandmother        History of Stints   Heart disease Maternal Grandfather    Diabetes Maternal Grandfather    Heart disease Maternal Aunt        History of Stents   Fibromyalgia Maternal Aunt    Past Surgical History:  Procedure Laterality Date   ADENOIDECTOMY     CERVICAL POLYPECTOMY N/A 2021   WISDOM TOOTH EXTRACTION     Social History   Social History Narrative   Not on file   Immunization History  Administered Date(s) Administered   Influenza, Seasonal, Injecte, Preservative Fre 07/31/2023   Tdap 07/31/2023     Objective: Vital Signs: BP 120/70 (BP Location: Right Arm, Patient Position: Sitting, Cuff Size: Large)   Pulse 66   Resp 14   Ht 5\' 4"  (1.626 m)   Wt 179 lb (81.2 kg)   LMP 01/01/2024   BMI 30.73 kg/m    Physical Exam HENT:     Mouth/Throat:     Mouth: Mucous membranes are moist.     Pharynx: Oropharynx is clear.  Eyes:     Conjunctiva/sclera: Conjunctivae normal.  Cardiovascular:     Rate and Rhythm: Normal rate and regular  rhythm.  Pulmonary:     Effort: Pulmonary effort is normal.     Breath sounds: Normal breath sounds.  Musculoskeletal:     Right lower leg: No edema.     Left lower leg: No edema.  Lymphadenopathy:     Cervical: No cervical adenopathy.  Skin:    General: Skin is warm and dry.     Findings: Rash present.     Comments: Redness throughout face, blanching, no papules or lesions Palmar hydrosis No digital pitting or nail changes Normal appearing nailfold capillaries  Neurological:     Mental Status: She is alert.  Psychiatric:        Mood and Affect: Mood normal.      Musculoskeletal Exam:  Shoulders full ROM no tenderness or swelling Elbows full ROM no tenderness or swelling Wrists full ROM no tenderness or swelling Fingers full ROM no tenderness or swelling Knees full ROM no tenderness or swelling Ankles full ROM no tenderness or swelling    Investigation: No additional findings.  Imaging: No results found.  Recent Labs: Lab Results  Component Value Date   WBC 13.7 (H) 08/24/2009   HGB 12.1 08/24/2009   PLT 264 08/24/2009   NA 138 08/24/2009   K 3.8 08/24/2009   CL 108 08/24/2009   CO2 23 08/24/2009   GLUCOSE 85 08/24/2009   BUN 12 08/24/2009   CREATININE 0.71 08/24/2009   BILITOT 0.7 08/24/2009   ALKPHOS 70 08/24/2009   AST 21 08/24/2009   ALT 13 08/24/2009   PROT 8.1 08/24/2009   ALBUMIN 4.1 08/24/2009   CALCIUM 9.5 08/24/2009   GFRAA  08/24/2009    NOT CALCULATED        The eGFR has been calculated using the MDRD equation. This calculation has not been validated in all clinical situations. eGFR's persistently <60 mL/min signify possible Chronic Kidney Disease.    Speciality Comments: No specialty comments available.  Procedures:  No procedures performed Allergies: Patient has no known allergies.   Assessment / Plan:     Visit Diagnoses: Positive ANA (antinuclear antibody) - Plan: C3 and C4, Rheumatoid factor, Cyclic citrul peptide  antibody, IgG, IgG, IgA, IgM, RPR Positive ANA at 1:320 with multiple symptoms but no specific clinical criteria. Differential of subclinical hypthyroidism in setting of ANA also includes Hashimoto's thyroiditis so checking TPO Abs as well. - Order thyroid antibody markers to rule out Hashimoto's thyroiditis. - Monitor symptoms for progression or new autoimmune indicators.  Malar rash Recurrent impetigo Recurrent facial impetigo due to staph infection, responsive to steroid topical treatment. - Continue steroid topical treatment as needed.  Subclinical hypothyroidism - Plan: Thyroid Peroxidase Antibodies (TPO) (REFL)  Family history of thrombosis - Plan: Cardiolipin antibodies, IgG, IgM, IgA, Lupus Anticoagulant Eval w/Reflex, Beta-2 glycoprotein antibodies  Myofascial pain syndrome Burning pain and sensitivity in shoulder area consistent with myofascial pain syndrome, not fibromyalgia. - Educated on non-drug symptom management including sleep, diet, mood, stress, and exercise.  Hyperhidrosis Excessive sweating, possibly related to thyroid dysfunction or autonomic dysregulation. - Evaluate for underlying causes including thyroid dysfunction.  Gastrointestinal issues with constipation and diarrhea Alternating constipation and diarrhea with cramping and mucus, possibly IBS. Compacted on x-ray, inconsistent Miralax use. - Encourage consistent Miralax use and follow up with gastroenterologist.  Tonsillitis with tonsil stones Recurrent tonsillitis with tonsil stones, hesitant about tonsillectomy due to adult complications.    Orders: Orders Placed This Encounter  Procedures   Cardiolipin antibodies, IgG, IgM, IgA   Lupus Anticoagulant Eval w/Reflex   Beta-2 glycoprotein antibodies   C3 and C4   Rheumatoid factor   Cyclic citrul peptide antibody, IgG   IgG, IgA, IgM   RPR   Thyroid Peroxidase Antibodies (TPO) (REFL)   No orders of the defined types were placed in this  encounter.   Follow-Up Instructions: Return in about 6 months (around 07/10/2024), or if symptoms worsen or fail to improve, for New pt +ANA f/u 6mos.   Fuller Plan, MD  Note - This record has been created using [employer].  Chart creation errors have been sought, but may not always  have been located. Such creation errors do not reflect on  the standard of medical care.

## 2024-01-10 ENCOUNTER — Encounter

## 2024-01-11 ENCOUNTER — Ambulatory Visit: Payer: Medicaid Other | Admitting: Gastroenterology

## 2024-01-13 LAB — BETA-2 GLYCOPROTEIN ANTIBODIES
Beta-2 Glyco 1 IgA: <2 U/mL (ref <20.0)
Beta-2 Glyco 1 IgM: <2 U/mL (ref <20.0)
Beta-2 Glyco I IgG: <2 U/mL (ref <20.0)

## 2024-01-13 LAB — IGG, IGA, IGM
IgG (Immunoglobin G), Serum: 1303 mg/dL (ref 600–1640)
IgM, Serum: 157 mg/dL (ref 50–300)
Immunoglobulin A: 333 mg/dL — ABNORMAL HIGH (ref 47–310)

## 2024-01-13 LAB — THYROID PEROXIDASE ANTIBODIES (TPO) (REFL): Thyroperoxidase Ab SerPl-aCnc: 1 [IU]/mL (ref <9)

## 2024-01-13 LAB — CARDIOLIPIN ANTIBODIES, IGG, IGM, IGA
Anticardiolipin IgA: <2 [APL'U]/mL (ref <20.0)
Anticardiolipin IgG: <2 [GPL'U]/mL (ref <20.0)
Anticardiolipin IgM: <2 [MPL'U]/mL (ref <20.0)

## 2024-01-13 LAB — RPR: RPR Ser Ql: NONREACTIVE

## 2024-01-13 LAB — C3 AND C4
C3 Complement: 185 mg/dL (ref 83–193)
C4 Complement: 29 mg/dL (ref 15–57)

## 2024-01-13 LAB — CYCLIC CITRUL PEPTIDE ANTIBODY, IGG: Cyclic Citrullin Peptide Ab: <16 U

## 2024-01-13 LAB — LUPUS ANTICOAGULANT EVAL W/ REFLEX
PTT-LA Screen: 27 s (ref <=40)
dRVVT: 30 s (ref <=45)

## 2024-01-13 LAB — RHEUMATOID FACTOR: Rheumatoid fact SerPl-aCnc: <10 [IU]/mL (ref <14)

## 2024-01-14 ENCOUNTER — Ambulatory Visit: Payer: Medicaid Other | Admitting: Family Medicine

## 2024-01-14 ENCOUNTER — Encounter: Payer: Self-pay | Admitting: Family Medicine

## 2024-01-14 ENCOUNTER — Telehealth: Payer: Self-pay | Admitting: Pharmacy Technician

## 2024-01-14 ENCOUNTER — Other Ambulatory Visit (HOSPITAL_COMMUNITY): Payer: Self-pay

## 2024-01-14 VITALS — BP 122/76 | HR 84 | Temp 98.2°F | Ht 64.0 in | Wt 176.4 lb

## 2024-01-14 DIAGNOSIS — E6609 Other obesity due to excess calories: Secondary | ICD-10-CM | POA: Diagnosis not present

## 2024-01-14 DIAGNOSIS — E038 Other specified hypothyroidism: Secondary | ICD-10-CM | POA: Diagnosis not present

## 2024-01-14 DIAGNOSIS — J301 Allergic rhinitis due to pollen: Secondary | ICD-10-CM

## 2024-01-14 DIAGNOSIS — E66811 Obesity, class 1: Secondary | ICD-10-CM | POA: Diagnosis not present

## 2024-01-14 DIAGNOSIS — Z6830 Body mass index (BMI) 30.0-30.9, adult: Secondary | ICD-10-CM

## 2024-01-14 DIAGNOSIS — E663 Overweight: Secondary | ICD-10-CM | POA: Insufficient documentation

## 2024-01-14 LAB — T4, FREE: Free T4: 0.83 ng/dL (ref 0.60–1.60)

## 2024-01-14 LAB — TSH: TSH: 2.54 u[IU]/mL (ref 0.35–5.50)

## 2024-01-14 MED ORDER — FLUTICASONE PROPIONATE 50 MCG/ACT NA SUSP: 1.0000 | Freq: Every day | NASAL | 0 refills | Status: DC

## 2024-01-14 MED ORDER — WEGOVY 0.5 MG/0.5ML ~~LOC~~ SOAJ
0.5000 mg | SUBCUTANEOUS | 0 refills | Status: DC
Start: 2024-01-14 — End: 2024-03-15

## 2024-01-14 MED ORDER — WEGOVY 0.25 MG/0.5ML ~~LOC~~ SOAJ: 0.2500 mg | SUBCUTANEOUS | 0 refills | Status: DC

## 2024-01-14 NOTE — Assessment & Plan Note (Signed)
 Some recent flare. I will renew her fluticasone nasal spray.

## 2024-01-14 NOTE — Telephone Encounter (Signed)
 Noted. Dm/cma

## 2024-01-14 NOTE — Telephone Encounter (Signed)
 Pharmacy Patient Advocate Encounter   Received notification from CoverMyMeds that prior authorization for [facility] 0.25MG /0.5ML auto-injectors is required/requested.   Insurance verification completed.   The patient is insured through [facility] .   Per test claim: PA required; PA submitted to above mentioned insurance via CoverMyMeds Key/confirmation #/EOC Syliva Overman Status is pending

## 2024-01-14 NOTE — Assessment & Plan Note (Signed)
 Discussed principles of weight management, including a foundation of a healthy, calorie-controlled diet and regular exercise. I recommend the patient achieve 150 minutes of moderate-intensity exercise weekly. We did discuss the role of medication to augment dietary and exercise efforts. She has lost 7 lbs in the past 2 months. I will go ahead and prescribe Wegovy to augment her weight loss effort.

## 2024-01-14 NOTE — Progress Notes (Signed)
 [facility] [address] [place] [address] Dept: [phone] Dept Fax: [phone]  Chronic Care Office Visit  Subjective:    Patient ID: Lauren Larson, female    DOB: 23-Apr-1991, 33 y.o..   MRN: [id number]  Chief Complaint  Patient presents with   Hypothyroidism    2 month f/u .  No concerns.   History of Present Illness:  Patient is in today for reassessment of chronic medical issues.  Lauren Larson has a history of subclinical hypothyroidism, with a normal T4 and elevated TSH. She notes an issue with sweating constantly. Since her last visit, she was seen by hematology related to an elevated Factor VIII activity and has seen rheumatology related to an elevated ANA. She is still undergoing evaluation related to these factors.  Lauren Larson asks about weight loss medication. She has been able to achieve some weight loss over the past several months. She has reduced her alcohol intake. She and her mother are going for 60-90 miun walks on a regular basis now. She notes she checked with her insurance and Lauren Larson is covered for her.  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   History of cervical polypectomy 08/28/2023   Menorrhagia 07/31/2023   Positive ANA (antinuclear antibody) 07/31/2023   Malar rash 07/31/2023   Hair loss 07/31/2023   Chronic diarrhea 07/31/2023   Cherry angioma 07/31/2023   Seasonal allergic rhinitis 07/31/2023   Marijuana use 07/31/2023   Past Surgical History:  Procedure Laterality Date   ADENOIDECTOMY     CERVICAL POLYPECTOMY N/A 2021   WISDOM TOOTH EXTRACTION     Family History  Problem Relation Age of Onset    Heart disease Mother    Diabetes Mother    Fibromyalgia Mother    Diabetes Maternal Grandmother    Heart disease Maternal Grandmother        History of Stints   Heart disease Maternal Grandfather    Diabetes Maternal Grandfather    Heart disease Maternal Aunt        History of Stents   Fibromyalgia Maternal Aunt    Outpatient Medications Prior to Visit  Medication Sig Dispense Refill   Polyethylene Glycol 3350 (MIRALAX PO) Take by mouth.     valACYclovir (VALTREX) 1000 MG tablet Take 2,000 mg by mouth 2 (two) times daily.     fluticasone (FLONASE) 50 MCG/ACT nasal spray Place 1 spray into both nostrils daily. 16 g 0   Fexofenadine HCl (MUCINEX ALLERGY PO) Take by mouth. (Patient not taking: Reported on 01/14/2024)     misoprostol (CYTOTEC) 200 MCG tablet Place two tablets in the vagina the night prior to your next clinic appointment (Patient not taking: Reported on 01/14/2024) 2 tablet 2   No facility-administered medications prior to visit.   No Known Allergies Objective:   Today's Vitals   01/14/24 0952  BP: 122/76  Pulse: 84  Temp: 98.2 F (36.8 C)  TempSrc: Temporal  SpO2: 99%  Weight: 176 lb 6.4 oz (80 kg)  Height: 5\' 4"  (1.626 m)   Body mass index is 30.28 kg/m.   General: Well developed, well nourished. No acute distress. Skin: Warm and generally sweaty. Psych:  Alert and oriented. Normal mood and affect.  Health Maintenance Due  Topic Date Due   HIV Screening  Never done     Assessment & Plan:   Problem List Items Addressed This Visit       Respiratory   Seasonal allergic rhinitis   Some recent flare. I will renew her fluticasone nasal spray.      Relevant Medications   fluticasone (FLONASE) 50 MCG/ACT nasal spray     Endocrine   Subclinical hypothyroidism - Primary   Mildly elevated TSH with normal free T4 consistent with subclinical hypothyroidism. Recent TPO Ab neg. I will repeat her thyroid testing today.      Relevant Orders   TSH   T4, free      Other   Class 1 obesity due to excess calories with body mass index (BMI) of 30.0 to 30.9 in adult   Discussed principles of weight management, including a foundation of a healthy, calorie-controlled diet and regular exercise. I recommend the patient achieve 150 minutes of moderate-intensity exercise weekly. We did discuss the role of medication to augment dietary and exercise efforts. She has lost 7 lbs in the past 2 months. I will go ahead and prescribe Wegovy to augment her weight loss effort.       Relevant Medications   Semaglutide-Weight Management (WEGOVY) 0.25 MG/0.5ML SOAJ   Semaglutide-Weight Management (WEGOVY) 0.5 MG/0.5ML SOAJ    Return in about 2 months (around 03/15/2024).   Loyola Mast, MD

## 2024-01-14 NOTE — Assessment & Plan Note (Signed)
 Mildly elevated TSH with normal free T4 consistent with subclinical hypothyroidism. Recent TPO Ab neg. I will repeat her thyroid testing today.

## 2024-01-15 ENCOUNTER — Encounter: Payer: Self-pay | Admitting: Family Medicine

## 2024-01-15 NOTE — Telephone Encounter (Signed)
 Pharmacy Patient Advocate Encounter  Received notification from [facility] that Prior Authorization for  [facility] 0.25MG /0.5ML auto-injectors has been APPROVED from 01-14-2024 to 07-15-2024   PA #/Case ID/Reference #: [id number] Overman

## 2024-01-18 NOTE — Telephone Encounter (Signed)
 Pharmacy notified and they will reach out to patient. Dm/cma

## 2024-02-16 NOTE — Progress Notes (Unsigned)
 Chief Complaint: follow up Primary GI MD:Dr. Rosaline Coma  HPI: 33 year old female presents for follow-up  Last seen in 11/13/2023 for change in bowel habits.  Previous history of constipation but no onset diarrhea with TSH 5.55.  KUB confirming constipation and suspicion for overflow diarrhea.  Recommended MiraLAX daily medication and follow-up with PCP for evaluation of thyroid  possibly leading to her change in bowel habits. Also given compound cream for anal fissure.  Previous workup Negative celiac serology Fecal calprotectin 6 TSH elevated at 5.55  Discussed the use of AI scribe software for clinical note transcription with the patient, who gave verbal consent to proceed.  History of Present Illness She experiences alternating episodes of diarrhea and constipation, with bowel movements occurring up to three times a day. Despite using Miralax once daily and fiber supplements, constipation often returns, followed by liquid stools. These measures have improved her symptoms, but constipation persists at times.  She has hemorrhoids that occasionally bleed and cause significant discomfort, especially during bowel movements. The hemorrhoids are painful, making sitting uncomfortable, and she feels them when wiping. Over-the-counter creams provide minimal relief. She did not receive a previously prescribed compound cream intended for a possible fissure.  She recently started taking Wegovy  for weight loss, which may contribute to her constipation. She is diligent with her fiber intake to counteract this potential side effect.  No straining during bowel movements despite the presence of hemorrhoids. Bleeding occurs primarily on the tissue paper after bowel movements, and the discomfort is significant, particularly when sitting. No recent changes in bowel movement patterns aside from the alternating constipation and diarrhea.   Past Medical History:  Diagnosis Date   Allergy    H/O cervical  polypectomy     Past Surgical History:  Procedure Laterality Date   ADENOIDECTOMY     CERVICAL POLYPECTOMY N/A 2021   WISDOM TOOTH EXTRACTION      Current Outpatient Medications  Medication Sig Dispense Refill   Fexofenadine HCl (MUCINEX ALLERGY PO) Take by mouth.     fluticasone  (FLONASE ) 50 MCG/ACT nasal spray Place 1 spray into both nostrils daily. 16 g 0   hydrocortisone (ANUSOL-HC) 2.5 % rectal cream Place 1 Application rectally 2 (two) times daily. 42 g 1   linaclotide (LINZESS) 145 MCG CAPS capsule Take 1 capsule (145 mcg total) by mouth daily before breakfast. 30 capsule 2   Polyethylene Glycol 3350 (MIRALAX PO) Take by mouth.     Semaglutide -Weight Management (WEGOVY ) 0.25 MG/0.5ML SOAJ Inject 0.25 mg into the skin once a week. 2 mL 0   valACYclovir  (VALTREX ) 1000 MG tablet Take 2,000 mg by mouth 2 (two) times daily.     Semaglutide -Weight Management (WEGOVY ) 0.5 MG/0.5ML SOAJ Inject 0.5 mg into the skin once a week. Start after 4 weeks on the 0.25 mg weekly dose. (Patient not taking: Reported on 02/17/2024) 2 mL 0   No current facility-administered medications for this visit.    Allergies as of 02/17/2024   (No Known Allergies)    Family History  Problem Relation Age of Onset   Heart disease Mother    Diabetes Mother    Fibromyalgia Mother    Diabetes Maternal Grandmother    Heart disease Maternal Grandmother        History of Stints   Heart disease Maternal Grandfather    Diabetes Maternal Grandfather    Heart disease Maternal Aunt        History of Stents   Fibromyalgia Maternal Aunt     Social  History   Socioeconomic History   Marital status: Significant Other    Spouse name: Not on file   Number of children: Not on file   Years of education: Not on file   Highest education level: 12th grade  Occupational History   Occupation: Leisure centre manager    Comment: Civil engineer, contracting  Tobacco Use   Smoking status: Never    Passive exposure: Past   Smokeless tobacco:  Never  Vaping Use   Vaping status: Some Days   Substances: Nicotine  Substance and Sexual Activity   Alcohol use: Yes    Comment: About twice a week and on the weekends.  All types.   Drug use: Never   Sexual activity: Yes  Other Topics Concern   Not on file  Social History Narrative   Not on file   Social Drivers of Health   Financial Resource Strain: Low Risk  (07/30/2023)   Overall Financial Resource Strain (CARDIA)    Difficulty of Paying Living Expenses: Not hard at all  Food Insecurity: No Food Insecurity (07/30/2023)   Hunger Vital Sign    Worried About Running Out of Food in the Last Year: Never true    Ran Out of Food in the Last Year: Never true  Transportation Needs: No Transportation Needs (07/30/2023)   PRAPARE - Administrator, Civil Service (Medical): No    Lack of Transportation (Non-Medical): No  Physical Activity: Insufficiently Active (07/30/2023)   Exercise Vital Sign    Days of Exercise per Week: 1 day    Minutes of Exercise per Session: 30 min  Stress: No Stress Concern Present (07/30/2023)   [employer] of Occupational Health - Occupational Stress Questionnaire    Feeling of Stress : Only a little  Social Connections: Unknown (07/30/2023)   Social Connection and Isolation Panel [NHANES]    Frequency of Communication with Friends and Family: More than three times a week    Frequency of Social Gatherings with Friends and Family: Twice a week    Attends Religious Services: Patient declined    Database administrator or Organizations: No    Attends Engineer, structural: Not on file    Marital Status: Living with partner  Intimate Partner Violence: Not on file    Review of Systems:    Constitutional: No weight loss, fever, chills, weakness or fatigue HEENT: Eyes: No change in vision               Ears, Nose, Throat:  No change in hearing or congestion Skin: No rash or itching Cardiovascular: No chest pain, chest pressure or  palpitations   Respiratory: No SOB or cough Gastrointestinal: See HPI and otherwise negative Genitourinary: No dysuria or change in urinary frequency Neurological: No headache, dizziness or syncope Musculoskeletal: No new muscle or joint pain Hematologic: No bleeding or bruising Psychiatric: No history of depression or anxiety    Physical Exam:  Vital signs: BP 102/80   Pulse 90   Ht 5\' 4"  (1.626 m)   Wt 167 lb 9.6 oz (76 kg)   LMP 01/27/2024 (Approximate)   BMI 28.77 kg/m   Constitutional: NAD, alert and cooperative Head:  Normocephalic and atraumatic. Eyes:   PEERL, EOMI. No icterus. Conjunctiva pink. Respiratory: Respirations even and unlabored. Lungs clear to auscultation bilaterally.   No wheezes, crackles, or rhonchi.  Cardiovascular:  Regular rate and rhythm. No peripheral edema, cyanosis or pallor.  Gastrointestinal:  Soft, nondistended, nontender. No rebound or guarding. Normal  bowel sounds. No appreciable masses or hepatomegaly. Rectal: Thrombosed external hemorrhoid, no obvious fissure, patient declined DRE Msk:  Symmetrical without gross deformities. Without edema, no deformity or joint abnormality.  Neurologic:  Alert and  oriented x4;  grossly normal neurologically.  Skin:   Dry and intact without significant lesions or rashes. Psychiatric: Oriented to person, place and time. Demonstrates good judgement and reason without abnormal affect or behaviors.  Physical Exam RECTAL: Large, near thrombosed, painful external hemorrhoid.   RELEVANT LABS AND IMAGING: CBC    Component Value Date/Time   WBC 13.7 (H) 08/24/2009 2056   RBC 3.86 08/24/2009 2056   HGB 12.1 08/24/2009 2056   HCT 35.3 (L) 08/24/2009 2056   PLT 264 08/24/2009 2056   MCV 91.5 08/24/2009 2056   MCHC 34.3 08/24/2009 2056   RDW 12.3 08/24/2009 2056   LYMPHSABS 2.6 08/24/2009 2056   MONOABS 0.4 08/24/2009 2056   EOSABS 0.0 08/24/2009 2056   BASOSABS 0.1 08/24/2009 2056    CMP     Component  Value Date/Time   NA 138 08/24/2009 2056   K 3.8 08/24/2009 2056   CL 108 08/24/2009 2056   CO2 23 08/24/2009 2056   GLUCOSE 85 08/24/2009 2056   BUN 12 08/24/2009 2056   CREATININE 0.71 08/24/2009 2056   CALCIUM 9.5 08/24/2009 2056   PROT 8.1 08/24/2009 2056   ALBUMIN 4.1 08/24/2009 2056   AST 21 08/24/2009 2056   ALT 13 08/24/2009 2056   ALKPHOS 70 08/24/2009 2056   BILITOT 0.7 08/24/2009 2056   GFRNONAA NOT CALCULATED 08/24/2009 2056   GFRAA  08/24/2009 2056    NOT CALCULATED        The eGFR has been calculated using the MDRD equation. This calculation has not been validated in all clinical situations. eGFR's persistently <60 mL/min signify possible Chronic Kidney Disease.     Assessment/Plan:   Constipation with overflow diarrhea Seen Jan 2025 for diarrhea with previous history of constipation. TSH at last visit 5.55 (now 2.54). KUB confirming constipation.  Negative celiac serology and fecal calprotectin of 6.  Recommended miralax daily which initially controlled symptoms.   Recently exacerbated by initiation of Wegovy  for weight loss - Linzess 145 mcg once daily - Patient will call and let us  know if Linzess works well for her - Continue to increase water, increase exercise, increase fiber - Educated on side effects of Wegovy  - Squatty potty -- follow up with Santina Cull PA-C in 8-12 weeks  Rectal bleeding Thrombosed external hemorrhoid Occasional scant blood on tissue paper during straining with bowel movements.  She did not previously pick up compound cream.  Some improvement with over-the-counter hemorrhoid cream.  Thrombosed external hemorrhoid noted on physical exam today. - Urgent referral to [facility] - Hydrocortisone cream 2.5% twice daily for 14 days - If persistent symptoms recommend she pick up compound cream as originally prescribed - If continued rectal bleeding would recommend colonoscopy  [facility] 02/17/2024,  10:24 AM  Cc: Graig Lawyer, MD

## 2024-02-17 ENCOUNTER — Ambulatory Visit (INDEPENDENT_AMBULATORY_CARE_PROVIDER_SITE_OTHER): Admitting: Gastroenterology

## 2024-02-17 ENCOUNTER — Encounter: Payer: Self-pay | Admitting: Gastroenterology

## 2024-02-17 VITALS — BP 102/80 | HR 90 | Ht 64.0 in | Wt 167.6 lb

## 2024-02-17 DIAGNOSIS — R197 Diarrhea, unspecified: Secondary | ICD-10-CM

## 2024-02-17 DIAGNOSIS — K5900 Constipation, unspecified: Secondary | ICD-10-CM | POA: Diagnosis not present

## 2024-02-17 DIAGNOSIS — K625 Hemorrhage of anus and rectum: Secondary | ICD-10-CM | POA: Diagnosis not present

## 2024-02-17 DIAGNOSIS — K645 Perianal venous thrombosis: Secondary | ICD-10-CM

## 2024-02-17 DIAGNOSIS — K5909 Other constipation: Secondary | ICD-10-CM

## 2024-02-17 MED ORDER — HYDROCORTISONE (PERIANAL) 2.5 % EX CREA: 1.0000 | TOPICAL_CREAM | Freq: Two times a day (BID) | CUTANEOUS | 1 refills | Status: DC

## 2024-02-17 MED ORDER — LINACLOTIDE 145 MCG PO CAPS: 145.0000 ug | ORAL_CAPSULE | Freq: Every day | ORAL | 2 refills | Status: DC

## 2024-02-17 NOTE — Patient Instructions (Addendum)
 We have sent the following medications to your pharmacy for you to pick up at your convenience: Linzess 145 mcg daily. Hydrocortisone cream twice daily.   We have given you samples of the following medication to take: Linzess 145 mcg daily.  We have placed a referral to [facility] and their office will reach out to you to schedule an appointment.   _______________________________________________________  If your blood pressure at your visit was 140/90 or greater, please contact your primary care physician to follow up on this.  _______________________________________________________  If you are age 31 or older, your body mass index should be between 23-30. Your Body mass index is 28.77 kg/m. If this is out of the aforementioned range listed, please consider follow up with your Primary Care Provider.  If you are age 10 or younger, your body mass index should be between 19-25. Your Body mass index is 28.77 kg/m. If this is out of the aformentioned range listed, please consider follow up with your Primary Care Provider.   ________________________________________________________  The [facility] providers would like to encourage you to use MYCHART to communicate with providers for non-urgent requests or questions.  Due to long hold times on the telephone, sending your provider a message by [facility] may be a faster and more efficient way to get a response.  Please allow 48 business hours for a response.  Please remember that this is for non-urgent requests.  _______________________________________________________

## 2024-02-29 NOTE — Progress Notes (Signed)
 I agree with the assessment and plan as outlined by Ms. McMichael.

## 2024-03-15 ENCOUNTER — Ambulatory Visit: Admitting: Family Medicine

## 2024-03-15 ENCOUNTER — Encounter: Payer: Self-pay | Admitting: Family Medicine

## 2024-03-15 VITALS — BP 120/74 | HR 78 | Temp 97.7°F | Ht 64.0 in | Wt 165.2 lb

## 2024-03-15 DIAGNOSIS — R110 Nausea: Secondary | ICD-10-CM

## 2024-03-15 DIAGNOSIS — K529 Noninfective gastroenteritis and colitis, unspecified: Secondary | ICD-10-CM | POA: Diagnosis not present

## 2024-03-15 DIAGNOSIS — E66811 Obesity, class 1: Secondary | ICD-10-CM | POA: Diagnosis not present

## 2024-03-15 DIAGNOSIS — E6609 Other obesity due to excess calories: Secondary | ICD-10-CM

## 2024-03-15 DIAGNOSIS — Z6830 Body mass index (BMI) 30.0-30.9, adult: Secondary | ICD-10-CM

## 2024-03-15 DIAGNOSIS — R61 Generalized hyperhidrosis: Secondary | ICD-10-CM | POA: Insufficient documentation

## 2024-03-15 DIAGNOSIS — K645 Perianal venous thrombosis: Secondary | ICD-10-CM | POA: Insufficient documentation

## 2024-03-15 MED ORDER — WEGOVY 0.5 MG/0.5ML ~~LOC~~ SOAJ: 0.5000 mg | SUBCUTANEOUS | 1 refills | Status: DC

## 2024-03-15 MED ORDER — ONDANSETRON 4 MG PO TBDP: 4.0000 mg | ORAL_TABLET | Freq: Three times a day (TID) | ORAL | 2 refills | Status: AC | PRN

## 2024-03-15 NOTE — Progress Notes (Signed)
 [facility]IMARY [facility] [address] [place] [address] Dept: [phone] Dept Fax: [phone]  Chronic Care Office Visit  Subjective:    Patient ID: Lauren Larson, female    DOB: 1991/03/27, 33 y.o..   MRN: [id number]  Chief Complaint  Patient presents with   Follow-up    2 month f/u.  C/o having some nausea.    History of Present Illness:  Patient is in today for reassessment of chronic medical issues.  Lauren Larson has class 1 obesity. She is currently managed on semaglutide  (Wegovy ). She is currently on 0.5 mg weekly. She notes most of her current weight loss occurred in the 1st month of therapy (on the 0.25 mg dose). She has been experiencing nausea with dry heaves on about the 2nd-3rd day after her injection.   Lauren Larson has a history of  chronic diarrhea. She has seen GI since her last visit. She is currently managed on  linactolide (Linzess ) and feels this is doing much better.   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 She has noted this over the past several months.  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   History of cervical polypectomy 08/28/2023   Menorrhagia 07/31/2023   Positive ANA (antinuclear antibody) 07/31/2023   Malar rash 07/31/2023   Hair loss 07/31/2023   Chronic diarrhea 07/31/2023   Cherry angioma 07/31/2023   Seasonal allergic rhinitis 07/31/2023   Marijuana use 07/31/2023   Past Surgical History:  Procedure Laterality Date   ADENOIDECTOMY     CERVICAL POLYPECTOMY N/A 2021   WISDOM TOOTH EXTRACTION     Family History  Problem Relation Age of Onset   Heart disease Mother    Diabetes Mother     Fibromyalgia Mother    Diabetes Maternal Grandmother    Heart disease Maternal Grandmother        History of Stints   Heart disease Maternal Grandfather    Diabetes Maternal Grandfather    Heart disease Maternal Aunt        History of Stents   Fibromyalgia Maternal Aunt    Outpatient Medications Prior to Visit  Medication Sig Dispense Refill   Fexofenadine HCl (MUCINEX ALLERGY PO) Take by mouth.     fluticasone  (FLONASE ) 50 MCG/ACT nasal spray Place 1 spray into both nostrils daily. 16 g 0   hydrocortisone  (ANUSOL -HC) 2.5 % rectal cream Place 1 Application rectally 2 (two) times daily. 42 g 1   linaclotide  (LINZESS ) 145 MCG CAPS capsule Take 1 capsule (145 mcg total) by mouth daily before breakfast. 30 capsule 2   Polyethylene Glycol 3350 (MIRALAX PO) Take by mouth.     valACYclovir  (VALTREX ) 1000 MG tablet Take 2,000 mg by mouth 2 (two) times daily.     Semaglutide -Weight Management (WEGOVY ) 0.25 MG/0.5ML SOAJ Inject 0.25 mg into the skin once a week. 2 mL 0   Semaglutide -Weight Management (WEGOVY ) 0.5 MG/0.5ML SOAJ Inject 0.5 mg into the skin once a week. Start after 4 weeks on the 0.25 mg weekly dose. 2 mL 0   No facility-administered medications prior to visit.   No Known Allergies Objective:   Today's Vitals   03/15/24  1037  BP: 120/74  Pulse: 78  Temp: 97.7 F (36.5 C)  TempSrc: Temporal  SpO2: 99%  Weight: 165 lb 3.2 oz (74.9 kg)  Height: 5\' 4"  (1.626 m)   Body mass index is 28.36 kg/m.   General: Well developed, well nourished. No acute distress. Psych: Alert and oriented. Normal mood and affect.  Health Maintenance Due  Topic Date Due   HIV Screening  Never done     Assessment & Plan:   Problem List Items Addressed This Visit       Digestive   Chronic diarrhea   Continue fiber supplement. Symptoms improved with linactolide (Linzess )        Other   Class 1 obesity due to excess calories with body mass index (BMI) of 30.0 to 30.9 in adult -  Primary   Maximum weight: 186 lbs (10/2023) Current weight: 165 lbs Weight change since last visit: - 11 lbs Total weight loss: - 21 lbs (11.3%)  Lauren Larson is doing well with weight loss. In light of her nausea, I am hesitant to increase her Wegovy  dose. If her weight loss stalls over the next month, she will let me know and we will consider increasing her dose to 1 mg. Continue walking/jogging on a regular basis.      Relevant Medications   Semaglutide -Weight Management (WEGOVY ) 0.5 MG/0.5ML SOAJ   Night sweats   Etiology remains uncertain. If persistent at next visit, will consider more in depth work-up.      Other Visit Diagnoses       Nausea       I will prescribe Zofran for symptom management.   Relevant Medications   ondansetron (ZOFRAN-ODT) 4 MG disintegrating tablet       Return in about 2 months (around 05/15/2024) for Reassessment.   Graig Lawyer, MD

## 2024-03-15 NOTE — Assessment & Plan Note (Signed)
 Maximum weight: 186 lbs (10/2023) Current weight: 165 lbs Weight change since last visit: - 11 lbs Total weight loss: - 21 lbs (11.3%)  Ms. Delamater is doing well with weight loss. In light of her nausea, I am hesitant to increase her Wegovy  dose. If her weight loss stalls over the next month, she will let me know and we will consider increasing her dose to 1 mg. Continue walking/jogging on a regular basis.

## 2024-03-15 NOTE — Assessment & Plan Note (Signed)
 Continue fiber supplement. Symptoms improved with linactolide (Linzess )

## 2024-03-15 NOTE — Assessment & Plan Note (Signed)
 Etiology remains uncertain. If persistent at next visit, will consider more in depth work-up.

## 2024-04-06 ENCOUNTER — Encounter: Payer: Self-pay | Admitting: Family Medicine

## 2024-04-06 DIAGNOSIS — E6609 Other obesity due to excess calories: Secondary | ICD-10-CM

## 2024-04-06 MED ORDER — WEGOVY 1 MG/0.5ML ~~LOC~~ SOAJ: 1.0000 mg | SUBCUTANEOUS | 3 refills | Status: DC

## 2024-04-07 ENCOUNTER — Encounter: Payer: Self-pay | Admitting: Family Medicine

## 2024-04-28 NOTE — Progress Notes (Deleted)
 04/28/2024 Lauren Larson [id number] May 04, 1991  Referring provider: Thedora Garnette HERO, MD Primary GI doctor: Dr. Federico  ASSESSMENT AND PLAN:  Constipation with diarrhea KUB 11/13/2023 stool throughout the colon seen with constipation Negative celiac serology, Fecal calprotectin 6, TSH elevated at 5.55 Worsened by Wegovy  Started on Linzess  145 mcg may  Thrombosed external hemorrhoid rectal bleeding, declined rectal exam Saw Dr. Debby 03/09/2024 with [facility] and suggested cream and correcting stool habits Start hydrocortisone  cream Consider colonoscopy  Patient Care Team: Thedora Garnette HERO, MD as PCP - General (Family Medicine)  HISTORY OF PRESENT ILLNESS: 33 y.o. female with a past medical history listed below presents for evaluation of ***.   Patient last seen in the office 02/21/2024 by Con Blower for chronic constipationProvided with Linzess  145  *** Discussed the use of AI scribe software for clinical note transcription with the patient, who gave verbal consent to proceed.  History of Present Illness            She  reports that she has never smoked. She has been exposed to tobacco smoke. She has never used smokeless tobacco. She reports current alcohol use. She reports that she does not use drugs.  RELEVANT GI HISTORY, IMAGING AND LABS: Results          CBC    Component Value Date/Time   WBC 13.7 (H) 08/24/2009 2056   RBC 3.86 08/24/2009 2056   HGB 12.1 08/24/2009 2056   HCT 35.3 (L) 08/24/2009 2056   PLT 264 08/24/2009 2056   MCV 91.5 08/24/2009 2056   MCHC 34.3 08/24/2009 2056   RDW 12.3 08/24/2009 2056   LYMPHSABS 2.6 08/24/2009 2056   MONOABS 0.4 08/24/2009 2056   EOSABS 0.0 08/24/2009 2056   BASOSABS 0.1 08/24/2009 2056   No results for input(s): HGB in the last 8760 hours.  CMP     Component Value Date/Time   NA 138 08/24/2009 2056   K 3.8 08/24/2009 2056   CL 108 08/24/2009 2056   CO2 23 08/24/2009 2056   GLUCOSE 85 08/24/2009 2056    BUN 12 08/24/2009 2056   CREATININE 0.71 08/24/2009 2056   CALCIUM 9.5 08/24/2009 2056   PROT 8.1 08/24/2009 2056   ALBUMIN 4.1 08/24/2009 2056   AST 21 08/24/2009 2056   ALT 13 08/24/2009 2056   ALKPHOS 70 08/24/2009 2056   BILITOT 0.7 08/24/2009 2056   GFRNONAA NOT CALCULATED 08/24/2009 2056   GFRAA  08/24/2009 2056    NOT CALCULATED        The eGFR has been calculated using the MDRD equation. This calculation has not been validated in all clinical situations. eGFR's persistently <60 mL/min signify possible Chronic Kidney Disease.      Latest Ref Rng & Units 08/24/2009    8:56 PM  Hepatic Function  Total Protein 6.0 - 8.3 g/dL 8.1   Albumin 3.5 - 5.2 g/dL 4.1   AST 0 - 37 U/L 21   ALT 0 - 35 U/L 13   Alk Phosphatase 47 - 119 U/L 70   Total Bilirubin 0.3 - 1.2 mg/dL 0.7       Current Medications:     Current Outpatient Medications (Respiratory):    Fexofenadine HCl (MUCINEX ALLERGY PO), Take by mouth.   fluticasone  (FLONASE ) 50 MCG/ACT nasal spray, Place 1 spray into both nostrils daily.    Current Outpatient Medications (Other):    hydrocortisone  (ANUSOL -HC) 2.5 % rectal cream, Place 1 Application rectally 2 (two) times daily.  linaclotide  (LINZESS ) 145 MCG CAPS capsule, Take 1 capsule (145 mcg total) by mouth daily before breakfast.   ondansetron  (ZOFRAN -ODT) 4 MG disintegrating tablet, Take 1 tablet (4 mg total) by mouth every 8 (eight) hours as needed.   Polyethylene Glycol 3350 (MIRALAX PO), Take by mouth.   Semaglutide -Weight Management (WEGOVY ) 1 MG/0.5ML SOAJ, Inject 1 mg into the skin once a week.   valACYclovir  (VALTREX ) 1000 MG tablet, Take 2,000 mg by mouth 2 (two) times daily.  Medical History:  Past Medical History:  Diagnosis Date   Allergy    H/O cervical polypectomy    Allergies: No Known Allergies   Surgical History:  She  has a past surgical history that includes Wisdom tooth extraction; Adenoidectomy; and Cervical polypectomy (N/A,  2021). Family History:  Her family history includes Diabetes in her maternal grandfather, maternal grandmother, and mother; Fibromyalgia in her maternal aunt and mother; Heart disease in her maternal aunt, maternal grandfather, maternal grandmother, and mother.  REVIEW OF SYSTEMS  : All other systems reviewed and negative except where noted in the History of Present Illness.  PHYSICAL EXAM: There were no vitals taken for this visit. Physical Exam          Alan JONELLE Coombs, PA-C 4:01 PM

## 2024-04-29 ENCOUNTER — Ambulatory Visit: Admitting: Physician Assistant

## 2024-05-10 ENCOUNTER — Encounter: Payer: Self-pay | Admitting: Family Medicine

## 2024-05-16 ENCOUNTER — Ambulatory Visit: Admitting: Family Medicine

## 2024-05-17 ENCOUNTER — Ambulatory Visit: Payer: Self-pay | Admitting: Family Medicine

## 2024-05-17 ENCOUNTER — Ambulatory Visit: Admitting: Family Medicine

## 2024-05-17 ENCOUNTER — Encounter: Payer: Self-pay | Admitting: Family Medicine

## 2024-05-17 VITALS — BP 116/69 | HR 91 | Temp 96.9°F | Ht 64.0 in | Wt 159.6 lb

## 2024-05-17 DIAGNOSIS — E663 Overweight: Secondary | ICD-10-CM

## 2024-05-17 DIAGNOSIS — R61 Generalized hyperhidrosis: Secondary | ICD-10-CM | POA: Diagnosis not present

## 2024-05-17 DIAGNOSIS — L659 Nonscarring hair loss, unspecified: Secondary | ICD-10-CM | POA: Diagnosis not present

## 2024-05-17 LAB — COMPREHENSIVE METABOLIC PANEL WITH GFR
ALT: 16 U/L (ref 0–35)
AST: 16 U/L (ref 0–37)
Albumin: 4.8 g/dL (ref 3.5–5.2)
Alkaline Phosphatase: 55 U/L (ref 39–117)
BUN: 14 mg/dL (ref 6–23)
CO2: 24 meq/L (ref 19–32)
Calcium: 9.9 mg/dL (ref 8.4–10.5)
Chloride: 102 meq/L (ref 96–112)
Creatinine, Ser: 0.84 mg/dL (ref 0.40–1.20)
GFR: 92.77 mL/min (ref >60.00)
Glucose, Bld: 87 mg/dL (ref 70–99)
Potassium: 3.9 meq/L (ref 3.5–5.1)
Sodium: 135 meq/L (ref 135–145)
Total Bilirubin: 0.5 mg/dL (ref 0.2–1.2)
Total Protein: 8.6 g/dL — ABNORMAL HIGH (ref 6.0–8.3)

## 2024-05-17 LAB — URINALYSIS, ROUTINE W REFLEX MICROSCOPIC
Bilirubin Urine: NEGATIVE
Ketones, ur: NEGATIVE
Leukocytes,Ua: NEGATIVE
Nitrite: NEGATIVE
Specific Gravity, Urine: >=1.03 — AB (ref 1.000–1.030)
Total Protein, Urine: NEGATIVE
Urine Glucose: NEGATIVE
Urobilinogen, UA: 0.2 (ref 0.0–1.0)
pH: 6 (ref 5.0–8.0)

## 2024-05-17 LAB — CBC
HCT: 38.7 % (ref 36.0–46.0)
Hemoglobin: 12.6 g/dL (ref 12.0–15.0)
MCHC: 32.6 g/dL (ref 30.0–36.0)
MCV: 89.1 fl (ref 78.0–100.0)
Platelets: 270 K/uL (ref 150.0–400.0)
RBC: 4.35 Mil/uL (ref 3.87–5.11)
RDW: 14 % (ref 11.5–15.5)
WBC: 6.1 K/uL (ref 4.0–10.5)

## 2024-05-17 LAB — TSH: TSH: 2.64 u[IU]/mL (ref 0.35–5.50)

## 2024-05-17 LAB — T4, FREE: Free T4: 0.93 ng/dL (ref 0.60–1.60)

## 2024-05-17 LAB — C-REACTIVE PROTEIN: CRP: <1 mg/dL (ref 0.5–20.0)

## 2024-05-17 NOTE — Assessment & Plan Note (Signed)
 Reviewed photos of hair loss noted after showers. 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 I will check labs, esp. related to her thyroid . I recommend she use a dandruff shampoo in light of the scaliness I note. She is seeing the dermatologist soon, and think they should discuss this as well.

## 2024-05-17 NOTE — Assessment & Plan Note (Signed)
 Etiology remains uncertain. I will initiate a lab evaluation for potential causes of hyperhydrosis/night sweats.

## 2024-05-17 NOTE — Assessment & Plan Note (Signed)
 Maximum weight: 186 lbs (10/2023) Current weight: 159 lbs Weight change since last visit: - 6 lbs Total weight loss: - 27 lbs (14.5 %)  Lauren Larson is doing well with weight loss. Continue semaglutide  (Wegovy ) 1 mg weekly. Continue walking/jogging on a regular basis.

## 2024-05-17 NOTE — Progress Notes (Signed)
 [facility] [address] [place] [address] Dept: [phone] Dept Fax: [phone]  Chronic Care Office Visit  Subjective:    Patient ID: Lauren Larson, female    DOB: 1991-08-08, 33 y.o..   MRN: [id number]  Chief Complaint  Patient presents with   Follow-up    2 month F/u.  Still c/o having some hair loss.    History of Present Illness:  Patient is in today for reassessment of chronic medical issues.  Lauren Larson has class 1 obesity. She is currently managed on semaglutide  (Wegovy ) 1 mg weekly. The nausea she was having has improved with time and better hydration.   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 She continues to notice this over the past 6 months.  Lauren Larson also notes she seems to be having more hair loss over the past month. She has not noted any specific areas of alopecia.  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   History of cervical polypectomy 08/28/2023   Menorrhagia 07/31/2023   Positive ANA (antinuclear antibody) 07/31/2023   Malar rash 07/31/2023   Hair loss 07/31/2023   Chronic diarrhea 07/31/2023   Cherry angioma 07/31/2023   Seasonal allergic rhinitis 07/31/2023   Marijuana use 07/31/2023   Past Surgical History:  Procedure Laterality Date   ADENOIDECTOMY     CERVICAL POLYPECTOMY N/A 2021   WISDOM TOOTH EXTRACTION     Family History  Problem Relation Age of Onset   Heart disease Mother    Diabetes Mother    Fibromyalgia Mother    Diabetes Maternal Grandmother    Heart disease Maternal Grandmother        History of Stints   Heart disease Maternal Grandfather    Diabetes Maternal Grandfather    Heart disease  Maternal Aunt        History of Stents   Fibromyalgia Maternal Aunt    Heart disease Maternal Aunt    Outpatient Medications Prior to Visit  Medication Sig Dispense Refill   Fexofenadine HCl (MUCINEX ALLERGY PO) Take by mouth.     fluticasone  (FLONASE ) 50 MCG/ACT nasal spray Place 1 spray into both nostrils daily. 16 g 0   hydrocortisone  (ANUSOL -HC) 2.5 % rectal cream Place 1 Application rectally 2 (two) times daily. 42 g 1   linaclotide  (LINZESS ) 145 MCG CAPS capsule Take 1 capsule (145 mcg total) by mouth daily before breakfast. 30 capsule 2   ondansetron  (ZOFRAN -ODT) 4 MG disintegrating tablet Take 1 tablet (4 mg total) by mouth every 8 (eight) hours as needed. 20 tablet 2   Polyethylene Glycol 3350 (MIRALAX PO) Take by mouth.     Semaglutide -Weight Management (WEGOVY ) 1 MG/0.5ML SOAJ Inject 1 mg into the skin once a week. 2 mL 3   valACYclovir  (VALTREX ) 1000 MG tablet Take 2,000 mg by mouth 2 (two) times daily.     No facility-administered medications prior to visit.   No Known Allergies Objective:   Today's Vitals   05/17/24 1037  BP: 116/69  Pulse: 91  Temp: (!) 96.9 F (36.1 C)  TempSrc: Temporal  SpO2: 98%  Weight: 159 lb 9.6 oz (72.4 kg)  Height: 5' 4 (1.626 m)   Body mass index is 27.4  kg/m.   General: Well developed, well nourished. No acute distress. Skin: Scalp shows some mild scaliness at the hair follicles. I do not see any areas of alopecia or   inflammation. Psych: Alert and oriented. Normal mood and affect.  Health Maintenance Due  Topic Date Due   HIV Screening  Never done   Hepatitis B Vaccines (1 of 3 - 19+ 3-dose series) Never done   HPV VACCINES (1 - 3-dose SCDM series) Never done   INFLUENZA VACCINE  05/13/2024     Assessment & Plan:   Problem List Items Addressed This Visit       Other   Hair loss   Reviewed photos of hair loss noted after showers. 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 I will check labs,  esp. related to her thyroid . I recommend she use a dandruff shampoo in light of the scaliness I note. She is seeing the dermatologist soon, and think they should discuss this as well.      Relevant Orders   TSH   T4, free   Comprehensive metabolic panel with GFR   Night sweats - Primary   Etiology remains uncertain. I will initiate a lab evaluation for potential causes of hyperhydrosis/night sweats.      Relevant Orders   QuantiFERON-TB Gold Plus   CBC   TSH   T4, free   HIV Antibody (routine testing w rflx)   C-reactive protein   Urinalysis, Routine w reflex microscopic   Comprehensive metabolic panel with GFR   Overweight (BMI 25.0-29.9)   Maximum weight: 186 lbs (10/2023) Current weight: 159 lbs Weight change since last visit: - 6 lbs Total weight loss: - 27 lbs (14.5 %)  Lauren Larson is doing well with weight loss. Continue semaglutide  (Wegovy ) 1 mg weekly. Continue walking/jogging on a regular basis.       Return in about 2 months (around 07/17/2024) for Reassessment.   [name], MD

## 2024-05-20 LAB — QUANTIFERON-TB GOLD PLUS
Mitogen-NIL: >10 [IU]/mL
NIL: 0.03 [IU]/mL
QuantiFERON-TB Gold Plus: NEGATIVE
TB1-NIL: 0 [IU]/mL
TB2-NIL: 0 [IU]/mL

## 2024-05-20 LAB — HIV ANTIBODY (ROUTINE TESTING W REFLEX): HIV 1&2 Ab, 4th Generation: NONREACTIVE

## 2024-05-23 ENCOUNTER — Ambulatory Visit: Admitting: Dermatology

## 2024-05-23 ENCOUNTER — Encounter: Payer: Self-pay | Admitting: Dermatology

## 2024-05-23 VITALS — BP 139/93 | HR 79

## 2024-05-23 DIAGNOSIS — Z1283 Encounter for screening for malignant neoplasm of skin: Secondary | ICD-10-CM | POA: Diagnosis not present

## 2024-05-23 DIAGNOSIS — D485 Neoplasm of uncertain behavior of skin: Secondary | ICD-10-CM | POA: Diagnosis not present

## 2024-05-23 DIAGNOSIS — L821 Other seborrheic keratosis: Secondary | ICD-10-CM

## 2024-05-23 DIAGNOSIS — D225 Melanocytic nevi of trunk: Secondary | ICD-10-CM

## 2024-05-23 DIAGNOSIS — L814 Other melanin hyperpigmentation: Secondary | ICD-10-CM

## 2024-05-23 DIAGNOSIS — D1801 Hemangioma of skin and subcutaneous tissue: Secondary | ICD-10-CM

## 2024-05-23 DIAGNOSIS — L578 Other skin changes due to chronic exposure to nonionizing radiation: Secondary | ICD-10-CM

## 2024-05-23 DIAGNOSIS — W908XXA Exposure to other nonionizing radiation, initial encounter: Secondary | ICD-10-CM

## 2024-05-23 DIAGNOSIS — L650 Telogen effluvium: Secondary | ICD-10-CM | POA: Diagnosis not present

## 2024-05-23 DIAGNOSIS — L739 Follicular disorder, unspecified: Secondary | ICD-10-CM

## 2024-05-23 DIAGNOSIS — D229 Melanocytic nevi, unspecified: Secondary | ICD-10-CM

## 2024-05-23 NOTE — Progress Notes (Signed)
 New Patient Visit   Subjective  Lauren Larson is a 33 y.o. female who presents for the following: Total Body Skin Exam (TBSE)  Patient present today for new patient visit for TBSE.The patient reports she has spots, moles and lesions to be evaluated, some may be new or changing and the patient may have concern these could be cancer. Patient has not previously been treated by a dermatologist.Patient reports she does not have hx of bx. Patient denies family history of skin cancers. Patient reports throughout her lifetime has had severe sun exposure. Currently, patient reports if she has excessive sun exposure, she does apply sunscreen and/or wears protective coverings.  The following portions of the chart were reviewed this encounter and updated as appropriate: medications, allergies, medical history  Review of Systems:  No other skin or systemic complaints except as noted in HPI or Assessment and Plan.  Objective  Well appearing patient in no apparent distress; mood and affect are within normal limits.  A full examination was performed including scalp, head, eyes, ears, nose, lips, neck, chest, axillae, abdomen, back, buttocks, bilateral upper extremities, bilateral lower extremities, hands, feet, fingers, toes, fingernails, and toenails. All findings within normal limits unless otherwise noted below.   Relevant exam findings are noted in the Assessment and Plan.  Right Abdomen (side) - Upper 4 mm dark brown pap Left Abdomen (side) - Upper 1 cm x 5 mm irregular brown macule  Assessment & Plan   LENTIGINES, seborrheic keratosis, HEMANGIOMAS - Benign normal skin lesions - Benign-appearing - Call for any changes  MELANOCYTIC NEVI - Tan-brown and/or pink-flesh-colored symmetric macules and papules - Benign appearing on exam today - Observation - Call clinic for new or changing moles - Recommend daily use of broad spectrum spf 30+ sunscreen to sun-exposed areas.   MILD ACTINIC  DAMAGE - Chronic condition, secondary to cumulative UV/sun exposure - diffuse scaly erythematous macules with underlying dyspigmentation - Recommend daily broad spectrum sunscreen SPF 30+ to sun-exposed areas, reapply every 2 hours as needed.  - Staying in the shade or wearing long sleeves, sun glasses (UVA+UVB protection) and wide brim hats (4-inch brim around the entire circumference of the hat) are also recommended for sun protection.  - Call for new or changing lesions.  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 It does not progress to baldness, and the hair eventually grows back on its own. It can be triggered by recent illness, recent surgery, thyroid  disease, low iron stores, vitamin D deficiency, fad diets or rapid weight loss, hormonal changes such as pregnancy or birth control pills, and some medication. Usually the hair loss starts 2-3 months after the illness or health change. Rarely, it can continue for longer than a year. Treatments options may include oral or topical Minoxidil; Red Light scalp treatments; Biotin 2.5 mg daily and other options.  Treatment Plan: - Recommended to monitor for re-growth and baby hairs staring in 6 months, if none noted recommended to contact our office to scheduled for a further evaluation  SKIN CANCER SCREENING PERFORMED TODAY  NEOPLASM OF UNCERTAIN BEHAVIOR OF SKIN (2) Right Abdomen (side) - Upper Epidermal / dermal shaving  Lesion diameter (cm):  0.4 Informed consent: discussed and consent obtained   Timeout: patient name, date of birth, surgical site, and procedure verified   Procedure prep:  Patient was prepped and draped in usual sterile fashion  Prep type:  Isopropyl alcohol Anesthesia: the lesion was anesthetized in a standard fashion   Anesthetic:  1% lidocaine w/ epinephrine 1-100,000  buffered w/ 8.4% NaHCO3 Instrument used: flexible razor blade   Hemostasis achieved with: pressure, aluminum chloride and electrodesiccation   Outcome: patient tolerated procedure well   Post-procedure details: sterile dressing applied and wound care instructions given   Dressing type: bandage and petrolatum    Specimen A - Surgical pathology Differential Diagnosis: R/O DN  Check Margins: No Left Abdomen (side) - Upper Epidermal / dermal shaving  Lesion diameter (cm):  1 Informed consent: discussed and consent obtained   Timeout: patient name, date of birth, surgical site, and procedure verified   Procedure prep:  Patient was prepped and draped in usual sterile fashion Prep type:  Isopropyl alcohol Anesthesia: the lesion was anesthetized in a standard fashion   Anesthetic:  1% lidocaine w/ epinephrine 1-100,000 buffered w/ 8.4% NaHCO3 Instrument used: flexible razor blade   Hemostasis achieved with: pressure, aluminum chloride and electrodesiccation   Outcome: patient tolerated procedure well   Post-procedure details: sterile dressing applied and wound care instructions given   Dressing type: bandage and petrolatum    Specimen B - Surgical pathology Differential Diagnosis: R/O DN vs Congenital Nevi  Check Margins: No  No follow-ups on file.  I, Jetta Ager, am acting as Neurosurgeon for [employer], DO.   Documentation: I have reviewed the above documentation for accuracy and completeness, and I agree with the above.  Delon Lenis, DO

## 2024-05-23 NOTE — Patient Instructions (Addendum)

## 2024-05-26 LAB — SURGICAL PATHOLOGY

## 2024-05-30 ENCOUNTER — Ambulatory Visit: Payer: Self-pay | Admitting: Dermatology

## 2024-07-06 ENCOUNTER — Ambulatory Visit: Attending: Internal Medicine | Admitting: Internal Medicine

## 2024-07-06 ENCOUNTER — Encounter: Payer: Self-pay | Admitting: Internal Medicine

## 2024-07-06 VITALS — BP 117/90 | HR 85 | Temp 98.2°F | Resp 16 | Ht 64.0 in | Wt 157.6 lb

## 2024-07-06 DIAGNOSIS — J301 Allergic rhinitis due to pollen: Secondary | ICD-10-CM | POA: Insufficient documentation

## 2024-07-06 DIAGNOSIS — R768 Other specified abnormal immunological findings in serum: Secondary | ICD-10-CM | POA: Diagnosis present

## 2024-07-06 DIAGNOSIS — M25572 Pain in left ankle and joints of left foot: Secondary | ICD-10-CM | POA: Diagnosis present

## 2024-07-06 NOTE — Patient Instructions (Signed)
 Generalized hair thinning can be difficult to cite a specific cause. I do not see any red flags for inflammatory damage and no scarring rash so affected areas should be able to return to normal. Consider supplementing with biotin and collagen and making sure diet is high in protein.

## 2024-07-06 NOTE — Progress Notes (Signed)
 Office Visit Note  Patient: Lauren Larson             Date of Birth: 28-May-1991           MRN: [id number]             PCP: Thedora Garnette HERO, MD Referring: Thedora Garnette HERO, MD Visit Date: 07/06/2024   Subjective:  Medical Management of Chronic Issues (Had a new rash come up over the summer on her legs and face. )   Discussed the use of AI scribe software for clinical note transcription with the patient, who gave verbal consent to proceed.  History of Present Illness   Lauren Larson is a 33 year old female who presents with rashes and hair loss and positive ANA serology.  She has been experiencing rashes since the summer, primarily affecting her legs and face. The rashes are itchy and painful when scratched, starting on her legs and spreading to her feet. They appear with sun exposure and resolve in about a week. She initially suspected sunscreen as a cause but found it too painful to apply. The facial rashes are limited to her chin, are itchy, but do not break open or drain.  She reports significant hair loss, which she partially attributes to her weight loss medication. She describes the hair loss as severe, noting that 'half my head of hair is gone,' with specific thinning at the back of her head where her cowlick is located. She takes a multivitamin and has ordered a supplement containing pumpkin seed oil and biotin.  She experiences tenderness in her feet and hands, particularly in specific areas of her feet and on a bone in her hand. The foot pain is severe and occurs even when not walking. Her job requires her to be on her feet frequently, which she believes contributes to her ankle pain. No swelling in her feet or other areas.  She has a history of cold sores occurring once or twice a year and experienced impetigo twice last year, initially thought to be cold sores in her nose. No current mouth or nose ulcers. She experiences dry eyes. No recent changes in allergy symptoms, such as nasal  congestion or eye irritation.  She has a history of constipation, attributed to her current medication. No recent gastrointestinal symptoms such as diarrhea, nausea, or vomiting. She takes ibuprofen  as needed for joint pain, which is helpful, especially at night when her joints ache more severely.  She has lost a significant amount of weight, from 195 pounds to 157 pounds, since April. She engages in regular exercise to maintain muscle mass.        Previous HPI 01/08/24 [name] is a 33 year old female who presents for evaluation of positive ANA and symptoms involving skin, joints, GI, and fatigue concern for inflammatory condition. She is accompanied by her mother who discusses an extensive history of fibromyalgia.   She has experienced rashes, joint pain, and fatigue for several years. A positive ANA test was noted in September of the previous year. She frequently experiences illness, including mouth sores and impetigo on her nose, and feels her immune system is compromised. Rashes, initially thought to be rosacea, have persisted for years. She reports generalized significant hair thinning.   Joint pain is particularly noted in her knees and elbows, with visible swelling in her knees after work. She describes a burning sensation in her shoulders, exacerbated by touch or clothing. She uses ibuprofen  for pain relief,  though its effectiveness is uncertain.   Severe digestive issues began last summer, characterized by extreme cramping, constipation, and diarrhea lasting up to two weeks, leading to increased work absences. She reports blood and mucus in her bowel movements, which she attributes to hemorrhoids. An x-ray indicated compaction, and she has a follow-up appointment scheduled. She has been advised to take Miralax but admits to inconsistent use.   She reports frequent swelling of her lymph nodes, particularly in the neck, and has a history of tonsil stones, which she attributes to her  swollen glands. She has not pursued a tonsillectomy due to fear of complications.   Excessive sweating since last summer, waking up drenched and sweating profusely at work, is a new development. She has had COVID-19 four times, with the last diagnosis about a year and a half ago.   Her family history includes fibromyalgia and rheumatoid arthritis, with her grandfather having had rheumatoid arthritis. Her sister had thyroid  problems and underwent thyroid  removal.      10/2023 Factor VIII 202% vWF wnl TSH 5.55 T4 1.08   07/2023 HCV neg Ttg, reticulin, gliadin Abs neg ESR 18 CRP <1   06/2023 ANA 1:320 speckled dsDNA, chromatin, SSA, SSB, Sm, UqRNP neg Complement C3 172 Complement C4 29   Review of Systems  Constitutional:  Positive for fatigue.  HENT:  Negative for mouth sores and mouth dryness.   Eyes:  Positive for dryness.  Respiratory:  Negative for shortness of breath.   Cardiovascular:  Negative for chest pain and palpitations.  Gastrointestinal:  Positive for constipation and diarrhea. Negative for blood in stool.  Endocrine: Negative for increased urination.  Genitourinary:  Negative for involuntary urination.  Musculoskeletal:  Positive for joint pain, joint pain, myalgias, morning stiffness, muscle tenderness and myalgias. Negative for gait problem, joint swelling and muscle weakness.  Skin:  Positive for rash, hair loss and sensitivity to sunlight. Negative for color change.  Allergic/Immunologic: Positive for susceptible to infections.  Neurological:  Positive for headaches. Negative for dizziness.  Hematological:  Negative for swollen glands.  Psychiatric/Behavioral:  Positive for sleep disturbance. Negative for depressed mood. The patient is not nervous/anxious.     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   History of cervical polypectomy 08/28/2023   Menorrhagia 07/31/2023   Positive ANA (antinuclear antibody) 07/31/2023   Malar rash 07/31/2023   Hair loss 07/31/2023   Chronic diarrhea 07/31/2023   Cherry angioma 07/31/2023   Seasonal allergic rhinitis 07/31/2023   Marijuana use 07/31/2023    Past Medical History:  Diagnosis Date   Allergy    H/O cervical polypectomy    Hypertension 05/14/23   Dentist told me, went to urgent care recently and the same    Family History  Problem Relation Age of Onset   Heart disease Mother    Diabetes Mother    Fibromyalgia Mother    Diabetes Maternal Grandmother    Heart disease Maternal Grandmother        History of Stints   Heart disease Maternal Grandfather    Diabetes Maternal Grandfather    Heart disease Maternal Aunt        History of Stents   Fibromyalgia  Maternal Aunt    Heart disease Maternal Aunt    Past Surgical History:  Procedure Laterality Date   ADENOIDECTOMY     CERVICAL POLYPECTOMY N/A 2021   WISDOM TOOTH EXTRACTION     Social History   Social History Narrative   Not on file   Immunization History  Administered Date(s) Administered   Influenza, Seasonal, Injecte, Preservative Fre 07/31/2023   Tdap 07/31/2023     Objective: Vital Signs: BP (!) 117/90   Pulse 85   Temp 98.2 F (36.8 C)   Resp 16   Ht 5' 4 (1.626 m)   Wt 157 lb 9.6 oz (71.5 kg)   LMP 06/11/2024   BMI 27.05 kg/m    Physical Exam Eyes:     Conjunctiva/sclera: Conjunctivae normal.  Cardiovascular:     Rate and Rhythm: Normal rate and regular rhythm.  Pulmonary:     Effort: Pulmonary effort is normal.     Breath sounds: Normal breath sounds.  Lymphadenopathy:     Cervical: No cervical adenopathy.  Skin:    General: Skin is warm and dry.     Comments: Blanching facial and neck erythema   Neurological:     Mental Status: She is alert.  Psychiatric:        Mood and Affect: Mood normal.      Musculoskeletal Exam:  Shoulders full ROM no tenderness or swelling Elbows full ROM no tenderness or swelling Wrists full ROM no tenderness or swelling Fingers full ROM no tenderness or swelling Knees full ROM no tenderness or swelling Left ankle tenderness to pressure anteriorly, mild bunion changes 1st MTP  Reviewed cell phone imaging of interval rashes, bumps or vesicular pattern rash on thigh photosensitive rash  Investigation: No additional findings.  Imaging: No results found.  Recent Labs: Lab Results  Component Value Date   WBC 6.1 05/17/2024   HGB 12.6 05/17/2024   PLT 270.0 05/17/2024   NA 135 05/17/2024   K 3.9 05/17/2024   CL 102 05/17/2024   CO2 24 05/17/2024   GLUCOSE 87 05/17/2024   BUN 14 05/17/2024   CREATININE 0.84 05/17/2024   BILITOT 0.5 05/17/2024   ALKPHOS 55 05/17/2024   AST 16 05/17/2024   ALT 16 05/17/2024   PROT 8.6 (H) 05/17/2024   ALBUMIN 4.8 05/17/2024   CALCIUM 9.9 05/17/2024   GFRAA  08/24/2009    NOT CALCULATED        The eGFR has been calculated using the MDRD equation. This calculation has not been validated in all clinical situations. eGFR's persistently <60 mL/min signify possible Chronic Kidney Disease.   QFTBGOLDPLUS NEGATIVE 05/17/2024    Speciality Comments: No specialty comments available.  Procedures:  No procedures performed Allergies: Patient has no known allergies.   Assessment / Plan:     Visit Diagnoses: Positive ANA (antinuclear antibody) Allergic skin reactions with pruritus, urticaria, and facial redness Intermittent rashes suggest histamine-mediated response. No inflammatory changes observed. Symptoms not severe enough for medication. - Consider topical steroids or oral antihistamines if symptoms worsen. - Advise her to send photos of rashes via MyChart if she recurs.  Positive ANA and dsDNA  antibody tests without clinical autoimmune disease Previous positive ANA and dsDNA tests without clinical signs of autoimmune disease. No new symptoms suggestive of lupus or rheumatoid arthritis. - Advise against retesting ANA and dsDNA unless new symptoms develop.  Left ankle pain and bunion Pain likely due to mild bunion formation and tendon tightness. Exacerbated by weight-bearing activities. Limited ankle  flexibility may contribute to bunion development. - Recommend plantar fasciitis type stretches to improve ankle flexibility. - Advise weight loss to reduce ankle pain. - Consider further intervention if pain persists.  Generalized hair loss Significant hair loss possibly exacerbated by weight loss medication. Thinning observed at cowlick area.  Dry eye syndrome Chronic dry eyes, especially during winter. - Recommend using a humidifier overnight to reduce eye dryness. - Continue using moisturizing eye drops as needed.  Constipation Chronic constipation possibly exacerbated by current medication. No new gastrointestinal symptoms reported. - Continue current management strategies for constipation.        Orders: No orders of the defined types were placed in this encounter.  No orders of the defined types were placed in this encounter.    Follow-Up Instructions: Return in about 1 year (around 07/06/2025) for +ANA obs f/u 25yr.   Lonni LELON Ester, MD  Note - This record has been created using [employer].  Chart creation errors have been sought, but may not always  have been located. Such creation errors do not reflect on  the standard of medical care.

## 2024-07-18 ENCOUNTER — Ambulatory Visit: Admitting: Family Medicine

## 2024-07-18 ENCOUNTER — Encounter: Payer: Self-pay | Admitting: Family Medicine

## 2024-07-18 VITALS — BP 118/74 | HR 67 | Temp 97.1°F | Ht 64.0 in | Wt 156.4 lb

## 2024-07-18 DIAGNOSIS — Z23 Encounter for immunization: Secondary | ICD-10-CM

## 2024-07-18 DIAGNOSIS — E66811 Obesity, class 1: Secondary | ICD-10-CM | POA: Diagnosis not present

## 2024-07-18 DIAGNOSIS — Z6826 Body mass index (BMI) 26.0-26.9, adult: Secondary | ICD-10-CM

## 2024-07-18 DIAGNOSIS — R7689 Other specified abnormal immunological findings in serum: Secondary | ICD-10-CM

## 2024-07-18 DIAGNOSIS — R61 Generalized hyperhidrosis: Secondary | ICD-10-CM | POA: Diagnosis not present

## 2024-07-18 DIAGNOSIS — L650 Telogen effluvium: Secondary | ICD-10-CM

## 2024-07-18 MED ORDER — WEGOVY 1.7 MG/0.75ML ~~LOC~~ SOAJ: 1.7000 mg | SUBCUTANEOUS | 2 refills | Status: DC

## 2024-07-18 NOTE — Assessment & Plan Note (Signed)
 Maximum weight: 186 lbs (10/2023) Current weight: 156 lbs Weight change since last visit: - 3 lbs Total weight loss: - 30 lbs (16.1 %)  Lauren Larson is doing well with weight loss though this has slowed down. I will increase her dose of  semaglutide  (Wegovy ) to 1.7 mg weekly. Continue walking/jogging on a regular basis.

## 2024-07-18 NOTE — Assessment & Plan Note (Signed)
 No specific etiology has surfaced based on testing. This appears to be more of an issue with hyperhidrosis. Discussed use of whole body antiperspirants to help control this.

## 2024-07-18 NOTE — Progress Notes (Signed)
 [facility]ARE [facility] [address] [place] [address] Dept: [phone] Dept Fax: [phone]  Chronic Care Office Visit  Subjective:    Patient ID: Lauren Larson, female    DOB: 1990/12/24, 33 y.o..   MRN: [id number]  Chief Complaint  Patient presents with   Follow-up    2 month f/u weight.  No concerns.  Flu shot today.    History of Present Illness:  Patient is in today for reassessment of chronic medical issues.  Lauren Larson has class 1 obesity. She is currently managed on semaglutide  (Wegovy ) 1 mg weekly. She is tolerating the medicine well at this point.   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 This has been present for about 9 months..   Lauren Larson continues to note more hair loss than typical, now for 3 months. She has not noted any specific areas of alopecia. She was seen by rheumatology related to findings of a positive ANA and facial rash, but did not have other supporting signs of lupus. Dr. Jeannetta has recommended following this for now.  Past Medical History: Patient Active Problem List   Diagnosis Date Noted   Pain in left ankle and joints of left foot 07/06/2024   Thrombosed external hemorrhoid 03/15/2024   Night sweats 03/15/2024   Class 1 obesity- Max. 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   Chronic diarrhea 07/31/2023   Cherry angioma 07/31/2023   Seasonal allergic rhinitis 07/31/2023   Marijuana use 07/31/2023   Past Surgical History:  Procedure Laterality Date   ADENOIDECTOMY     CERVICAL POLYPECTOMY N/A 2021   WISDOM TOOTH EXTRACTION     Family History  Problem Relation Age of Onset   Heart disease Mother    Diabetes  Mother    Fibromyalgia Mother    Diabetes Maternal Grandmother    Heart disease Maternal Grandmother        History of Stints   Heart disease Maternal Grandfather    Diabetes Maternal Grandfather    Heart disease Maternal Aunt        History of Stents   Fibromyalgia Maternal Aunt    Heart disease Maternal Aunt    Outpatient Medications Prior to Visit  Medication Sig Dispense Refill   Fexofenadine HCl (MUCINEX ALLERGY PO) Take by mouth. (Patient taking differently: Take by mouth as needed.)     fluticasone  (FLONASE ) 50 MCG/ACT nasal spray Place 1 spray into both nostrils daily. 16 g 0   hydrocortisone  (ANUSOL -HC) 2.5 % rectal cream Place 1 Application rectally 2 (two) times daily. (Patient taking differently: Place 1 Application rectally as needed.) 42 g 1   linaclotide  (LINZESS ) 145 MCG CAPS capsule Take 1 capsule (145 mcg total) by mouth daily before breakfast. 30 capsule 2   ondansetron  (ZOFRAN -ODT) 4 MG disintegrating tablet Take 1 tablet (4 mg total) by mouth every 8 (eight) hours as needed. 20 tablet 2   Polyethylene Glycol 3350 (MIRALAX PO) Take by mouth.     valACYclovir  (VALTREX ) 1000 MG tablet Take 2,000 mg by mouth 2 (two) times daily. (Patient taking differently: Take 2,000 mg by mouth as needed.)     Semaglutide -Weight Management (WEGOVY ) 1 MG/0.5ML SOAJ Inject 1 mg into the  skin once a week. 2 mL 3   No facility-administered medications prior to visit.   No Known Allergies Objective:   Today's Vitals   07/18/24 1043  BP: 118/74  Pulse: 67  Temp: (!) 97.1 F (36.2 C)  TempSrc: Temporal  SpO2: 100%  Weight: 156 lb 6.4 oz (70.9 kg)  Height: 5' 4 (1.626 m)   Body mass index is 26.85 kg/m.   General: Well developed, well nourished. No acute distress. Scalp: No sign of alopcia or significantly thinning hair. There is a small amount of scaliness. Psych: Alert and oriented. Normal mood and affect.  Health Maintenance Due  Topic Date Due   Hepatitis B Vaccines  19-59 Average Risk (1 of 3 - 19+ 3-dose series) Never done   HPV VACCINES (1 - Risk 3-dose SCDM series) Never done   Influenza Vaccine  05/13/2024     Assessment & Plan:   Problem List Items Addressed This Visit       Musculoskeletal and Integument   Telogen effluvium   This continues to be consistent with telogen effluvium. I reassured her that baldness is very unlikely to occur an d we will monitor this for now.        Other   Class 1 obesity- Max. BMI 32.8 - Primary   Maximum weight: 186 lbs (10/2023) Current weight: 156 lbs Weight change since last visit: - 3 lbs Total weight loss: - 30 lbs (16.1 %)  Lauren Larson is doing well with weight loss though this has slowed down. I will increase her dose of  semaglutide  (Wegovy ) to 1.7 mg weekly. Continue walking/jogging on a regular basis.      Relevant Medications   semaglutide -weight management (WEGOVY ) 1.7 MG/0.75ML SOAJ SQ injection   Night sweats   No specific etiology has surfaced based on testing. This appears to be more of an issue with hyperhidrosis. Discussed use of whole body antiperspirants to help control this.      Positive ANA (antinuclear antibody)   Following with Dr. Jeannetta. He will see her again in 1 year.      Other Visit Diagnoses       Need for immunization against influenza       Relevant Orders   Flu vaccine trivalent PF, 6mos and older(Flulaval,Afluria,Fluarix,Fluzone) (Completed)       Return in about 2 months (around 09/17/2024) for Reassessment.   [name], MD

## 2024-07-18 NOTE — Assessment & Plan Note (Signed)
 This continues to be consistent with telogen effluvium. I reassured her that baldness is very unlikely to occur an d we will monitor this for now.

## 2024-07-18 NOTE — Assessment & Plan Note (Signed)
 Following with Dr. Jeannetta. He will see her again in 1 year.

## 2024-07-21 ENCOUNTER — Telehealth: Payer: Self-pay

## 2024-07-21 NOTE — Telephone Encounter (Signed)
 Pharmacy Patient Advocate Encounter   Received notification from CoverMyMeds that prior authorization for Wegovy  1.7MG /0.75ML auto-injectors is required/requested.   Insurance verification completed.   The patient is insured through [facility] MEDICAID.   Effective October 1st, Medicaid will discontinue coverage of GLP1 medications for weight loss (such as Wegovy  and Zepbound), unless the patient has a documented history of a heart attack or stroke. Zepbound will continue to be covered only for patients with moderate to severe sleep apnea (AHI 15-30) and a BMI greater than 40. Because of this change, the prior authorization team will not be submitting new PA requests for GLP1 medications prescribed for weight loss, as patients will be unable to continue therapy under Medicaid coverage.

## 2024-08-01 ENCOUNTER — Encounter: Payer: Self-pay | Admitting: Family Medicine

## 2024-08-03 ENCOUNTER — Other Ambulatory Visit (HOSPITAL_COMMUNITY): Payer: Self-pay

## 2024-08-08 ENCOUNTER — Other Ambulatory Visit (HOSPITAL_COMMUNITY): Payer: Self-pay

## 2024-09-16 ENCOUNTER — Encounter: Payer: Self-pay | Admitting: Family Medicine

## 2024-09-16 ENCOUNTER — Ambulatory Visit: Admitting: Family Medicine

## 2024-09-16 VITALS — BP 158/70 | HR 75 | Temp 98.4°F | Ht 64.0 in | Wt 157.4 lb

## 2024-09-16 DIAGNOSIS — E66811 Obesity, class 1: Secondary | ICD-10-CM

## 2024-09-16 DIAGNOSIS — J301 Allergic rhinitis due to pollen: Secondary | ICD-10-CM | POA: Diagnosis not present

## 2024-09-16 MED ORDER — PHENTERMINE HCL 15 MG PO CAPS: 15.0000 mg | ORAL_CAPSULE | ORAL | 0 refills | Status: DC

## 2024-09-16 MED ORDER — FLUTICASONE PROPIONATE 50 MCG/ACT NA SUSP: 1.0000 | Freq: Every day | NASAL | 0 refills | Status: AC

## 2024-09-16 NOTE — Assessment & Plan Note (Addendum)
 No specific etiology has surfaced based on testing. This appears to be more of an issue with hyperhidrosis.

## 2024-09-16 NOTE — Assessment & Plan Note (Addendum)
 Maximum weight: 186 lbs (10/2023) Current weight: 157 lbs Weight change since last visit: 0 lbs Total weight loss: - 29 lbs (15.6 %)  Ms. Oser did well with the use of a GLP-1, but feels she cannot afford this now that her insurance is not covering this. She should continue walking/jogging on a regular basis. We discussed other weight loss medications. She would like a trial on phentermine  to see if she can finish reaching her goal weight. We did discuss the risk and benefits. I will see her back in 1 month to assess her response.

## 2024-09-16 NOTE — Assessment & Plan Note (Signed)
 Following with Dr. Jeannetta. He will see her again in 1 year.

## 2024-09-16 NOTE — Progress Notes (Signed)
 [facility] [address] [place] [address] Dept: [phone] Dept Fax: [phone]  Chronic Care Office Visit  Subjective:    Patient ID: Lauren Larson, female    DOB: 11/13/1990, 33 y.o..   MRN: [id number]  Chief Complaint  Patient presents with   Obesity    2 month f/u weight.     History of Present Illness:  Patient is in today for reassessment of chronic medical conditions.   Lauren Larson has class 1 obesity. She had been managed on semaglutide  (Wegovy ) 1.7 mg weekly. However, her insurance has dropped coverage for GLP-1 RAs for weight loss.  Lauren Larson has looked into other options, esp. to obtain compounded GLP-1s, but has been hesitant to move forward with these. She has remained active and recently completed a 5k run.  Past Medical History: Patient Active Problem List   Diagnosis Date Noted   Pain in left ankle and joints of left foot 07/06/2024   Thrombosed external hemorrhoid 03/15/2024   Night sweats 03/15/2024   Class 1 obesity- Max. 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   Chronic diarrhea 07/31/2023   Cherry angioma 07/31/2023   Seasonal allergic rhinitis 07/31/2023   Marijuana use 07/31/2023   Past Surgical History:  Procedure Laterality Date   ADENOIDECTOMY     CERVICAL POLYPECTOMY N/A 2021   WISDOM TOOTH EXTRACTION     Family History  Problem Relation Age of Onset   Heart disease Mother    Diabetes Mother    Fibromyalgia Mother    Diabetes Maternal Grandmother    Heart disease Maternal Grandmother        History of Stints   Heart disease Maternal Grandfather    Diabetes Maternal Grandfather    Heart disease Maternal Aunt         History of Stents   Fibromyalgia Maternal Aunt    Heart disease Maternal Aunt    Outpatient Medications Prior to Visit  Medication Sig Dispense Refill   Fexofenadine HCl (MUCINEX ALLERGY PO) Take by mouth. (Patient taking differently: Take by mouth as needed.)     fluticasone  (FLONASE ) 50 MCG/ACT nasal spray Place 1 spray into both nostrils daily. 16 g 0   hydrocortisone  (ANUSOL -HC) 2.5 % rectal cream Place 1 Application rectally 2 (two) times daily. (Patient taking differently: Place 1 Application rectally as needed.) 42 g 1   linaclotide  (LINZESS ) 145 MCG CAPS capsule Take 1 capsule (145 mcg total) by mouth daily before breakfast. 30 capsule 2   ondansetron  (ZOFRAN -ODT) 4 MG disintegrating tablet Take 1 tablet (4 mg total) by mouth every 8 (eight) hours as needed. 20 tablet 2   Polyethylene Glycol 3350 (MIRALAX PO) Take by mouth.     semaglutide -weight management (WEGOVY ) 1.7 MG/0.75ML SOAJ SQ injection Inject 1.7 mg into the skin once a week. 3 mL 2   valACYclovir  (VALTREX ) 1000 MG tablet Take 2,000 mg by mouth 2 (two) times daily. (Patient taking differently: Take 2,000 mg by mouth as needed.)     No facility-administered medications prior to visit.   No Known Allergies   Objective:   Today's Vitals   09/16/24 1404  BP: (!) 158/70  Pulse: 75  Temp: 98.4 F (36.9 C)  TempSrc: Temporal  SpO2: 99%  Weight: 157 lb 6.4 oz (71.4 kg)  Height: 5' 4 (1.626 m)   Body mass index is 27.02 kg/m.   General: Well developed, well nourished. No acute distress. Psych: Alert and oriented. Normal mood and affect.  Health Maintenance Due  Topic Date Due   COVID-19 Vaccine (1) Never done   Hepatitis B Vaccines 19-59 Average Risk (1 of 3 - 19+ 3-dose series) Never done   HPV VACCINES (1 - Risk 3-dose SCDM series) Never done     Assessment & Plan:   Problem List Items Addressed This Visit       Respiratory   Seasonal allergic rhinitis   Relevant Medications   fluticasone  (FLONASE ) 50  MCG/ACT nasal spray     Other   Class 1 obesity- Max. BMI 32.8 - Primary   Maximum weight: 186 lbs (10/2023) Current weight: 157 lbs Weight change since last visit: 0 lbs Total weight loss: - 29 lbs (15.6 %)  Lauren Larson did well with the use of a GLP-1, but feels she cannot afford this now that her insurance is not covering this. She should continue walking/jogging on a regular basis. We discussed other weight loss medications. She would like a trial on phentermine  to see if she can finish reaching her goal weight. We did discuss the risk and benefits. I will see her back in 1 month to assess her response.      Relevant Medications   phentermine  15 MG capsule    Return in about 4 weeks (around 10/14/2024) for Reassessment.   [name], MD  I,Emily Lagle,acting as a scribe for [name], MD.,have documented all relevant documentation on the behalf of [name], MD.  I, [name], MD, have reviewed all documentation for this visit. The documentation on 09/16/2024 for the exam, diagnosis, procedures, and orders are all accurate and complete.

## 2024-09-16 NOTE — Assessment & Plan Note (Signed)
 This continues to be consistent with telogen effluvium. I reassured her that baldness is very unlikely to occur an d we will monitor this for now.

## 2024-09-21 ENCOUNTER — Encounter: Payer: Self-pay | Admitting: Gastroenterology

## 2024-09-21 ENCOUNTER — Ambulatory Visit (INDEPENDENT_AMBULATORY_CARE_PROVIDER_SITE_OTHER): Admitting: Gastroenterology

## 2024-09-21 VITALS — BP 122/72 | HR 79 | Ht 64.0 in | Wt 158.0 lb

## 2024-09-21 DIAGNOSIS — R143 Flatulence: Secondary | ICD-10-CM

## 2024-09-21 DIAGNOSIS — R110 Nausea: Secondary | ICD-10-CM

## 2024-09-21 DIAGNOSIS — R1030 Lower abdominal pain, unspecified: Secondary | ICD-10-CM | POA: Diagnosis not present

## 2024-09-21 DIAGNOSIS — K5909 Other constipation: Secondary | ICD-10-CM | POA: Diagnosis not present

## 2024-09-21 DIAGNOSIS — K648 Other hemorrhoids: Secondary | ICD-10-CM | POA: Diagnosis not present

## 2024-09-21 DIAGNOSIS — K625 Hemorrhage of anus and rectum: Secondary | ICD-10-CM | POA: Diagnosis not present

## 2024-09-21 DIAGNOSIS — R140 Abdominal distension (gaseous): Secondary | ICD-10-CM

## 2024-09-21 MED ORDER — HYDROCORTISONE ACETATE 25 MG RE SUPP: 25.0000 mg | Freq: Every evening | RECTAL | 0 refills | Status: AC

## 2024-09-21 MED ORDER — LINACLOTIDE 72 MCG PO CAPS: 72.0000 ug | ORAL_CAPSULE | Freq: Every day | ORAL | 3 refills | Status: AC

## 2024-09-21 NOTE — Progress Notes (Signed)
 Chief Complaint:blood in stool Primary GI Doctor:Dr. Federico  HPI:  Patient is a 33 year old female patient with past medical history of chronic constipation who presents for follow-up for a evaluation of blood in stool.  03/09/24 seen by Dr. Bernarda Ned for thrombosed external hemorrhoid. Conservative measures recommended at that time.  Interval History Patient last seen in GI office on 02/17/24 by [facility] for altered bowel habits.  Patient presents for evaluation of BRBPR with wiping and in stool. She notes no straining. Patient has BM every 1-2 days.  She takes OTC fiber supplement and Linzess  145mcg po prn. She notes Linzess  causes too much diarrhea so she can't take it when she works. No rectal pain today.  She has a lot of odorous gas and bloating  She reports she has had intermittently generalized abdominal pain. Seen at urgent care for abdominal pain with nausea. She reports they did an abdominal xray which she was told was normal. No recent exposure or illness.   She is no longer on Wegovy .  She recently switched to phentermine  due to insurance coverage.   Wt Readings from Last 3 Encounters:  09/21/24 158 lb (71.7 kg)  09/16/24 157 lb 6.4 oz (71.4 kg)  07/18/24 156 lb 6.4 oz (70.9 kg)   Past Medical History:  Diagnosis Date   Allergy    H/O cervical polypectomy    Hypertension 05/14/23   Dentist told me, went to urgent care recently and the same    Past Surgical History:  Procedure Laterality Date   ADENOIDECTOMY     CERVICAL POLYPECTOMY N/A 2021   WISDOM TOOTH EXTRACTION      Current Outpatient Medications  Medication Sig Dispense Refill   fluticasone  (FLONASE ) 50 MCG/ACT nasal spray Place 1 spray into both nostrils daily. 16 g 0   hydrocortisone  (ANUSOL -HC) 25 MG suppository Place 1 suppository (25 mg total) rectally at bedtime. 10 suppository 0   linaclotide  (LINZESS ) 72 MCG capsule Take 1 capsule (72 mcg total) by mouth daily before breakfast. 90 capsule  3   phentermine  15 MG capsule Take 1 capsule (15 mg total) by mouth every morning. 30 capsule 0   Polyethylene Glycol 3350 (MIRALAX PO) Take by mouth.     Fexofenadine HCl (MUCINEX ALLERGY PO) Take by mouth. (Patient not taking: Reported on 09/21/2024)     ondansetron  (ZOFRAN -ODT) 4 MG disintegrating tablet Take 1 tablet (4 mg total) by mouth every 8 (eight) hours as needed. (Patient not taking: Reported on 09/21/2024) 20 tablet 2   valACYclovir  (VALTREX ) 1000 MG tablet Take 2,000 mg by mouth 2 (two) times daily. (Patient not taking: Reported on 09/21/2024)     No current facility-administered medications for this visit.    Allergies as of 09/21/2024   (No Known Allergies)    Family History  Problem Relation Age of Onset   Heart disease Mother    Diabetes Mother    Fibromyalgia Mother    Diabetes Maternal Grandmother    Heart disease Maternal Grandmother        History of Stints   Heart disease Maternal Grandfather    Diabetes Maternal Grandfather    Heart disease Maternal Aunt        History of Stents   Fibromyalgia Maternal Aunt    Heart disease Maternal Aunt     Review of Systems:    Constitutional: No weight loss, fever, chills, weakness or fatigue HEENT: Eyes: No change in vision  Ears, Nose, Throat:  No change in hearing or congestion Skin: No rash or itching Cardiovascular: No chest pain, chest pressure or palpitations   Respiratory: No SOB or cough Gastrointestinal: See HPI and otherwise negative Genitourinary: No dysuria or change in urinary frequency Neurological: No headache, dizziness or syncope Musculoskeletal: No new muscle or joint pain Hematologic: No bleeding or bruising Psychiatric: No history of depression or anxiety    Physical Exam:  Vital signs: BP 122/72   Pulse 79   Ht 5' 4 (1.626 m)   Wt 158 lb (71.7 kg)   LMP 08/25/2024   BMI 27.12 kg/m   Constitutional:   Pleasant female appears to be in NAD, Well developed, Well  nourished, alert and cooperative Eyes:   PEERL, EOMI. No icterus. Conjunctiva pink. Neck:  Supple Throat: Oral cavity and pharynx without inflammation, swelling or lesion.  Respiratory: Respirations even and unlabored. Lungs clear to auscultation bilaterally.   No wheezes, crackles, or rhonchi.  Cardiovascular: Normal S1, S2. Regular rate and rhythm. No peripheral edema, cyanosis or pallor.  Gastrointestinal:  Soft, nondistended, lower abdominal tenderness with palpation. No rebound or guarding. Normal bowel sounds. No appreciable masses or hepatomegaly. Rectal: Normal external rectal exam, normal rectal tone, appreciated internal hemorrhoids, non-tender, no masses, , brown stool, hemoccult N/A . Chaperone Denise Anoscopy: internal hemorrhoids noted Msk:  Symmetrical without gross deformities. Without edema, no deformity or joint abnormality.  Neurologic:  Alert and  oriented x4;  grossly normal neurologically.  Skin:   Dry and intact without significant lesions or rashes.  RELEVANT LABS AND IMAGING: CBC    Latest Ref Rng & Units 05/17/2024   11:40 AM 08/24/2009    8:56 PM  CBC  WBC 4.0 - 10.5 K/uL 6.1  13.7   Hemoglobin 12.0 - 15.0 g/dL 87.3  87.8   Hematocrit 36.0 - 46.0 % 38.7  35.3   Platelets 150.0 - 400.0 K/uL 270.0  264      CMP     Latest Ref Rng & Units 05/17/2024   11:40 AM 08/24/2009    8:56 PM  CMP  Glucose 70 - 99 mg/dL 87  85   BUN 6 - 23 mg/dL 14  12   Creatinine 9.59 - 1.20 mg/dL 9.15  9.28   Sodium 864 - 145 mEq/L 135  138   Potassium 3.5 - 5.1 mEq/L 3.9  3.8   Chloride 96 - 112 mEq/L 102  108   CO2 19 - 32 mEq/L 24  23   Calcium 8.4 - 10.5 mg/dL 9.9  9.5   Total Protein 6.0 - 8.3 g/dL 8.6  8.1   Total Bilirubin 0.2 - 1.2 mg/dL 0.5  0.7   Alkaline Phos 39 - 117 U/L 55  70   AST 0 - 37 U/L 16  21   ALT 0 - 35 U/L 16  13      Lab Results  Component Value Date   TSH 2.64 05/17/2024  Previous workup: Negative celiac serology Fecal calprotectin 6 TSH  2.64  Imaging: 11/13/23 abdominal xray IMPRESSION: Stool throughout the colon as can be seen with constipation.  Assessment: Encounter Diagnoses  Name Primary?   Rectal bleeding Yes   Internal hemorrhoids    Chronic constipation    Lower abdominal pain    Bloating    Flatulence    Nausea without vomiting      33 year old female patient that presents with abdominal pain, nausea, and rectal bleeding. Previously evaluated for external  hemorrhoids and treated with topical hydrocortisone . Upon exam today it was noted she ha dinternal hemorrhoids, will treat with Anusol  suppositories and discussed high fiber diet and will reduce the Linzess  to 72 mcg po daily. No recent imaging will order CTAP to rule out appendicitis, diverticulitis, and/or colitis. Recommend low fodmap diet. Provided samples of Ibgard.  For gas and bloat will order Sibo test to r/o bacterial overgrowth. Previous workup: Negative celiac serology, Fecal calprotectin 6, TSH 2.64. If negative workup and no improvement consider endoscopic procedures.   Plan: - recommend low fodmap diet -Ibgard samples 2 capsules w/ meals -Anusol  suppository 1 at bedtime -Order CTAP  -Decreased Linzess  to 72 mcg po daily  - Order Sibo breath test  -consider pelvic floor therapy if no improvement   Thank you for the courtesy of this consult. Please call me with any questions or concerns.   Zeah Germano, FNP-C [facility] 09/21/2024, 1:38 PM  Cc: Thedora Garnette HERO, MD

## 2024-09-21 NOTE — Patient Instructions (Addendum)
 Gas/bloating Recommend low fod map diet OTC IB gard  2 capsules with meals Will check   Constipation Decrease linzess  to 72mcg po daily, take 1 capsule 30-45 mins before first meal of day Consider pelvic floor therapy  Hemorrhoids No straining or pushing No prolonged sitting Continue fiber supplement One Ansuol suppository at bedtime If no improvement call the office or message me via Mychart  You have been scheduled for a CT scan of the abdomen and pelvis at [facility], 1st floor Radiology. You are scheduled on 09/29/24 at 2:00pm. You should arrive 15 minutes prior to your appointment time for registration. The purpose of you drinking the oral contrast is to aid in the visualization of your intestinal tract. The contrast solution may cause some diarrhea. Depending on your individual set of symptoms, you may also receive an intravenous injection of x-ray contrast/dye. Plan on being at [facility] for 45 minutes or longer, depending on the type of exam you are having performed.   If you have any questions regarding your exam or if you need to reschedule, you may call [facility] at [phone] between the hours of 8:00 am and 5:00 pm, Monday-Friday.   You have been given a testing kit to check for small intestine bacterial overgrowth (SIBO) which is completed by a company named [employer]. Make sure to return your test in the mail using the return mailing label given to you along with the kit. The test order, your demographic and insurance information have all already been sent to the company. [facility] will collect an upfront charge of $109.00 for commercial insurance plans and $229.00 if you are paying cash. The potential remaining total after claim submission and review is $120.00. Make sure to discuss with [facility] PRIOR to having the test to see if they have gotten information from your insurance company as to how much your testing will cost out of  pocket, if any. Please contact [employer] at phone number [phone] to get instructions regarding how to perform the test as our office is unable to give specific testing instructions.   _______________________________________________________  If your blood pressure at your visit was 140/90 or greater, please contact your primary care physician to follow up on this.  _______________________________________________________  If you are age 32 or older, your body mass index should be between 23-30. Your Body mass index is 27.12 kg/m. If this is out of the aforementioned range listed, please consider follow up with your Primary Care Provider.  If you are age 81 or younger, your body mass index should be between 19-25. Your Body mass index is 27.12 kg/m. If this is out of the aformentioned range listed, please consider follow up with your Primary Care Provider.   ________________________________________________________  The [facility] providers would like to encourage you to use MYCHART to communicate with providers for non-urgent requests or questions.  Due to long hold times on the telephone, sending your provider a message by [facility] may be a faster and more efficient way to get a response.  Please allow 48 business hours for a response.  Please remember that this is for non-urgent requests.  _______________________________________________________  [facility] is using a team-based approach to care.  Your team is made up of your doctor and two to three APPS. Our APPS (Nurse Practitioners and Physician Assistants) work with your physician to ensure care continuity for you. They are fully qualified to address your health concerns and develop a treatment plan. They communicate directly with  your gastroenterologist to care for you. Seeing the Advanced Practice Practitioners on your physician's team can help you by facilitating care more promptly, often allowing for earlier  appointments, access to diagnostic testing, procedures, and other specialty referrals.   Thank you for trusting me with your gastrointestinal care. Deanna May, FNP-C

## 2024-09-27 ENCOUNTER — Ambulatory Visit (HOSPITAL_COMMUNITY): Admission: RE | Admit: 2024-09-27 | Discharge: 2024-09-27 | Attending: Gastroenterology

## 2024-09-27 DIAGNOSIS — K625 Hemorrhage of anus and rectum: Secondary | ICD-10-CM | POA: Insufficient documentation

## 2024-09-27 DIAGNOSIS — R110 Nausea: Secondary | ICD-10-CM

## 2024-09-27 DIAGNOSIS — R1030 Lower abdominal pain, unspecified: Secondary | ICD-10-CM | POA: Diagnosis present

## 2024-09-27 MED ORDER — IOHEXOL 300 MG/ML  SOLN
100.0000 mL | Freq: Once | INTRAMUSCULAR | Status: AC | PRN
  Administered 2024-09-27: 18:00:00 100 mL via INTRAVENOUS

## 2024-09-29 ENCOUNTER — Ambulatory Visit (HOSPITAL_COMMUNITY)

## 2024-09-30 ENCOUNTER — Ambulatory Visit: Payer: Self-pay | Admitting: Gastroenterology

## 2024-10-02 NOTE — Progress Notes (Signed)
 Agree with the assessment and plan as outlined by [facility], FNP-C.    Ginni Eichler E. Tomasa Rand, MD [facility]

## 2024-10-21 ENCOUNTER — Encounter: Payer: Self-pay | Admitting: Family Medicine

## 2024-10-21 DIAGNOSIS — E66811 Obesity, class 1: Secondary | ICD-10-CM

## 2024-10-21 MED ORDER — WEGOVY 0.25 MG/0.5ML ~~LOC~~ SOAJ: 0.2500 mg | SUBCUTANEOUS | 0 refills | Status: AC

## 2024-10-21 MED ORDER — WEGOVY 0.5 MG/0.5ML ~~LOC~~ SOAJ: 0.5000 mg | SUBCUTANEOUS | 0 refills | Status: AC

## 2024-10-21 NOTE — Telephone Encounter (Signed)
 Please review patient message and advise. Thanks. Dm/cma

## 2024-10-25 ENCOUNTER — Ambulatory Visit: Admitting: Family Medicine

## 2024-10-25 NOTE — Telephone Encounter (Signed)
 Can you please and thank you do a PA for the Wegovy ? Thanks. Dm/cma

## 2024-10-31 ENCOUNTER — Telehealth: Payer: Self-pay

## 2024-10-31 ENCOUNTER — Other Ambulatory Visit (HOSPITAL_COMMUNITY): Payer: Self-pay

## 2024-10-31 NOTE — Telephone Encounter (Signed)
 Pharmacy Patient Advocate Encounter   Received notification from Patient Advice Request messages that prior authorization for Wegovy  0.25mg /0.31ml is required/requested.   Insurance verification completed.   The patient is insured through [facility] MEDICAID.   Per test claim: PA required; PA submitted to above mentioned insurance via Latent Key/confirmation #/EOC [id number] Status is pending

## 2024-11-01 NOTE — Telephone Encounter (Signed)
 Pharmacy Patient Advocate Encounter  Received notification from [facility] MEDICAID that Prior Authorization for Wegovy  0.25mg /0.51ml has been DENIED.  See denial reason below. No denial letter attached in CMM. Will attach denial letter to Media tab once received.   PA #/Case ID/Reference #: [id number]

## 2024-11-01 NOTE — Telephone Encounter (Signed)
 Sent a my chart message to inform patient of denial. Dm/cma

## 2024-11-02 NOTE — Telephone Encounter (Signed)
 Can you please and thank you submit an appeal with last OV notes and required information about the BMI.   Thanks. Dm/cma

## 2024-11-04 ENCOUNTER — Other Ambulatory Visit (HOSPITAL_COMMUNITY): Payer: Self-pay

## 2024-11-04 ENCOUNTER — Telehealth: Payer: Self-pay | Admitting: Pharmacist

## 2024-11-04 NOTE — Telephone Encounter (Signed)
See other note.  Dm/cma

## 2024-11-04 NOTE — Telephone Encounter (Signed)
 Medicaid has very stringent approval criteria for GLP-1 therapy. Per the denial notification, if a patients BMI is less than 30, documentation must also support at least one weight-related comorbidity, risk factor, or complication (e.g., hypertension, type 2 diabetes, sleep-disordered breathing, cardiovascular disease, or hyperlipidemia). After reviewing the chart, I do not see documentation that meets this requirement. Please let me know if there is relevant information that may have been overlooked, as an appeal would not be successful without supporting documentation.   Thank you, Devere Pandy, PharmD Clinical Pharmacist  [place]  Direct Dial: [phone]

## 2024-11-04 NOTE — Telephone Encounter (Signed)
Left VM to rtn call. Dm/cma       

## 2024-11-04 NOTE — Telephone Encounter (Signed)
 Please give pt a cb.

## 2024-11-04 NOTE — Telephone Encounter (Signed)
 Patient aware that we are working on this. Dm/cma

## 2024-11-07 ENCOUNTER — Telehealth: Payer: Self-pay | Admitting: Pharmacist

## 2024-11-07 NOTE — Telephone Encounter (Signed)
 Appeal has been submitted for Wegovy . Will advise when response is received, please be advised that most companies may take 30 days to make a decision. Appeal letter and supporting documentation have been faxed to insurance at [phone] on 11/07/2024 @8 :32 am.  Thank you, Devere Pandy, PharmD Clinical Pharmacist  [place]  Direct Dial: [phone]

## 2024-11-15 ENCOUNTER — Other Ambulatory Visit (HOSPITAL_COMMUNITY): Payer: Self-pay

## 2024-11-17 NOTE — Progress Notes (Unsigned)
 "  Chief Complaint:follow-up, blood in stool Primary GI Doctor:Dr. Federico  HPI:  Patient is a 34 year old female patient with past medical history of chronic constipation who presents for follow-up for a evaluation of blood in stool.  03/09/24 seen by Dr. Bernarda Ned for thrombosed external hemorrhoid. Conservative measures recommended at that time.  Interval History Patient last seen in GI office on 09/21/24 by myself.  Patient presents for evaluation of BRBPR with wiping and in stool. She notes no straining. Patient has BM every 1-2 days.  She takes OTC fiber supplement and Linzess  145mcg po prn. She notes Linzess  causes too much diarrhea so she can't take it when she works. No rectal pain today.  She has a lot of odorous gas and bloating  She reports she has had intermittently generalized abdominal pain. Seen at urgent care for abdominal pain with nausea. She reports they did an abdominal xray which she was told was normal. No recent exposure or illness.   She is no longer on Wegovy .  She recently switched to phentermine  due to insurance coverage.   Wt Readings from Last 3 Encounters:  09/21/24 158 lb (71.7 kg)  09/16/24 157 lb 6.4 oz (71.4 kg)  07/18/24 156 lb 6.4 oz (70.9 kg)   Past Medical History:  Diagnosis Date   Allergy    H/O cervical polypectomy    Hypertension 05/14/23   Dentist told me, went to urgent care recently and the same    Past Surgical History:  Procedure Laterality Date   ADENOIDECTOMY     CERVICAL POLYPECTOMY N/A 2021   WISDOM TOOTH EXTRACTION      Current Outpatient Medications  Medication Sig Dispense Refill   Fexofenadine HCl (MUCINEX ALLERGY PO) Take by mouth. (Patient not taking: Reported on 09/21/2024)     fluticasone  (FLONASE ) 50 MCG/ACT nasal spray Place 1 spray into both nostrils daily. 16 g 0   hydrocortisone  (ANUSOL -HC) 25 MG suppository Place 1 suppository (25 mg total) rectally at bedtime. 10 suppository 0   linaclotide  (LINZESS ) 72  MCG capsule Take 1 capsule (72 mcg total) by mouth daily before breakfast. 90 capsule 3   ondansetron  (ZOFRAN -ODT) 4 MG disintegrating tablet Take 1 tablet (4 mg total) by mouth every 8 (eight) hours as needed. (Patient not taking: Reported on 09/21/2024) 20 tablet 2   Polyethylene Glycol 3350 (MIRALAX PO) Take by mouth.     semaglutide -weight management (WEGOVY ) 0.25 MG/0.5ML SOAJ SQ injection Inject 0.25 mg into the skin once a week. 2 mL 0   semaglutide -weight management (WEGOVY ) 0.5 MG/0.5ML SOAJ SQ injection Inject 0.5 mg into the skin once a week. Start after 4 weeks on the 0.25 mg weekly dose. 2 mL 0   valACYclovir  (VALTREX ) 1000 MG tablet Take 2,000 mg by mouth 2 (two) times daily. (Patient not taking: Reported on 09/21/2024)     No current facility-administered medications for this visit.    Allergies as of 11/18/2024 - Review Complete 09/21/2024  Allergen Reaction Noted   Phentermine   10/21/2024    Family History  Problem Relation Age of Onset   Heart disease Mother    Diabetes Mother    Fibromyalgia Mother    Diabetes Maternal Grandmother    Heart disease Maternal Grandmother        History of Stints   Heart disease Maternal Grandfather    Diabetes Maternal Grandfather    Heart disease Maternal Aunt        History of Stents   Fibromyalgia Maternal  Aunt    Heart disease Maternal Aunt     Review of Systems:    Constitutional: No weight loss, fever, chills, weakness or fatigue HEENT: Eyes: No change in vision               Ears, Nose, Throat:  No change in hearing or congestion Skin: No rash or itching Cardiovascular: No chest pain, chest pressure or palpitations   Respiratory: No SOB or cough Gastrointestinal: See HPI and otherwise negative Genitourinary: No dysuria or change in urinary frequency Neurological: No headache, dizziness or syncope Musculoskeletal: No new muscle or joint pain Hematologic: No bleeding or bruising Psychiatric: No history of depression or  anxiety    Physical Exam:  Vital signs: There were no vitals taken for this visit.  Constitutional:   Pleasant female appears to be in NAD, Well developed, Well nourished, alert and cooperative Eyes:   PEERL, EOMI. No icterus. Conjunctiva pink. Neck:  Supple Throat: Oral cavity and pharynx without inflammation, swelling or lesion.  Respiratory: Respirations even and unlabored. Lungs clear to auscultation bilaterally.   No wheezes, crackles, or rhonchi.  Cardiovascular: Normal S1, S2. Regular rate and rhythm. No peripheral edema, cyanosis or pallor.  Gastrointestinal:  Soft, nondistended, lower abdominal tenderness with palpation. No rebound or guarding. Normal bowel sounds. No appreciable masses or hepatomegaly. Rectal: not performed  Msk:  Symmetrical without gross deformities. Without edema, no deformity or joint abnormality.  Neurologic:  Alert and  oriented x4;  grossly normal neurologically.  Skin:   Dry and intact without significant lesions or rashes.  RELEVANT LABS AND IMAGING: CBC    Latest Ref Rng & Units 05/17/2024   11:40 AM 08/24/2009    8:56 PM  CBC  WBC 4.0 - 10.5 K/uL 6.1  13.7   Hemoglobin 12.0 - 15.0 g/dL 87.3  87.8   Hematocrit 36.0 - 46.0 % 38.7  35.3   Platelets 150.0 - 400.0 K/uL 270.0  264      CMP     Latest Ref Rng & Units 05/17/2024   11:40 AM 08/24/2009    8:56 PM  CMP  Glucose 70 - 99 mg/dL 87  85   BUN 6 - 23 mg/dL 14  12   Creatinine 9.59 - 1.20 mg/dL 9.15  9.28   Sodium 864 - 145 mEq/L 135  138   Potassium 3.5 - 5.1 mEq/L 3.9  3.8   Chloride 96 - 112 mEq/L 102  108   CO2 19 - 32 mEq/L 24  23   Calcium 8.4 - 10.5 mg/dL 9.9  9.5   Total Protein 6.0 - 8.3 g/dL 8.6  8.1   Total Bilirubin 0.2 - 1.2 mg/dL 0.5  0.7   Alkaline Phos 39 - 117 U/L 55  70   AST 0 - 37 U/L 16  21   ALT 0 - 35 U/L 16  13      Lab Results  Component Value Date   TSH 2.64 05/17/2024  Previous workup: Negative celiac serology Fecal calprotectin 6 TSH  2.64  Imaging: 11/13/23 abdominal xray IMPRESSION: Stool throughout the colon as can be seen with constipation.  09/2024 CTAP IMPRESSION: 1. No acute abnormality in the abdomen or pelvis. 2. Evaluation for intraluminal intestinal hemorrhage is significantly limited by radiopaque enteric contrast material. 3. Small volume free fluid in the rectouterine pouch, likely physiologic.  Assessment:      34 year old female patient that presents with abdominal pain, nausea, and rectal bleeding. Previously evaluated  for external hemorrhoids and treated with topical hydrocortisone . Upon exam today it was noted she ha dinternal hemorrhoids, will treat with Anusol  suppositories and discussed high fiber diet and will reduce the Linzess  to 72 mcg po daily. No recent imaging will order CTAP to rule out appendicitis, diverticulitis, and/or colitis. Recommend low fodmap diet. Provided samples of Ibgard.  For gas and bloat will order Sibo test to r/o bacterial overgrowth. Previous workup: Negative celiac serology, Fecal calprotectin 6, TSH 2.64. If negative workup and no improvement consider endoscopic procedures.   Plan: - recommend low fodmap diet -Ibgard samples 2 capsules w/ meals -Anusol  suppository 1 at bedtime -Order CTAP  -Decreased Linzess  to 72 mcg po daily  - Order Sibo breath test  -consider pelvic floor therapy if no improvement   Thank you for the courtesy of this consult. Please call me with any questions or concerns.   Donald Memoli, FNP-C [facility] 11/17/2024, 4:39 PM  Cc: Thedora Garnette HERO, MD  "

## 2024-11-18 ENCOUNTER — Ambulatory Visit: Admitting: Gastroenterology

## 2025-07-06 ENCOUNTER — Ambulatory Visit: Admitting: Internal Medicine
